# Patient Record
Sex: Female | Born: 1960 | Hispanic: No | Marital: Married | Smoking: Never smoker
Health system: Southern US, Community
[De-identification: ages and names within clinical notes are randomized; demographics above are authoritative.]

## PROBLEM LIST (undated history)

## (undated) DIAGNOSIS — I10 Essential (primary) hypertension: Secondary | ICD-10-CM

---

## 2007-02-02 DIAGNOSIS — R87610 Atypical squamous cells of undetermined significance on cytologic smear of cervix (ASC-US): Secondary | ICD-10-CM | POA: Insufficient documentation

## 2007-02-10 ENCOUNTER — Encounter (INDEPENDENT_AMBULATORY_CARE_PROVIDER_SITE_OTHER): Payer: Self-pay | Admitting: Nurse Practitioner

## 2007-02-18 ENCOUNTER — Emergency Department (HOSPITAL_COMMUNITY): Admission: EM | Admit: 2007-02-18 | Discharge: 2007-02-18 | Payer: Self-pay | Admitting: Emergency Medicine

## 2007-05-26 ENCOUNTER — Encounter (INDEPENDENT_AMBULATORY_CARE_PROVIDER_SITE_OTHER): Payer: Self-pay | Admitting: Internal Medicine

## 2007-05-26 ENCOUNTER — Ambulatory Visit: Payer: Self-pay | Admitting: Nurse Practitioner

## 2007-05-26 DIAGNOSIS — IMO0002 Reserved for concepts with insufficient information to code with codable children: Secondary | ICD-10-CM

## 2007-05-26 DIAGNOSIS — I10 Essential (primary) hypertension: Secondary | ICD-10-CM | POA: Insufficient documentation

## 2007-05-26 DIAGNOSIS — M1710 Unilateral primary osteoarthritis, unspecified knee: Secondary | ICD-10-CM | POA: Insufficient documentation

## 2007-06-01 ENCOUNTER — Encounter (INDEPENDENT_AMBULATORY_CARE_PROVIDER_SITE_OTHER): Payer: Self-pay | Admitting: Nurse Practitioner

## 2007-06-22 ENCOUNTER — Ambulatory Visit: Payer: Self-pay | Admitting: Nurse Practitioner

## 2007-06-22 DIAGNOSIS — J00 Acute nasopharyngitis [common cold]: Secondary | ICD-10-CM | POA: Insufficient documentation

## 2007-06-27 ENCOUNTER — Other Ambulatory Visit: Admission: RE | Admit: 2007-06-27 | Discharge: 2007-06-27 | Payer: Self-pay | Admitting: Family Medicine

## 2007-06-27 ENCOUNTER — Ambulatory Visit: Payer: Self-pay | Admitting: Nurse Practitioner

## 2007-06-27 LAB — CONVERTED CEMR LAB
Bilirubin Urine: NEGATIVE
Chlamydia, DNA Probe: NEGATIVE
GC Probe Amp, Genital: NEGATIVE
Nitrite: NEGATIVE
Pap Smear: NEGATIVE
Protein, U semiquant: NEGATIVE
Urobilinogen, UA: 0.2
WBC Urine, dipstick: NEGATIVE

## 2007-07-13 ENCOUNTER — Ambulatory Visit: Payer: Self-pay | Admitting: Nurse Practitioner

## 2007-12-06 ENCOUNTER — Ambulatory Visit: Payer: Self-pay | Admitting: Nurse Practitioner

## 2007-12-06 DIAGNOSIS — R3129 Other microscopic hematuria: Secondary | ICD-10-CM

## 2007-12-06 DIAGNOSIS — M545 Low back pain: Secondary | ICD-10-CM

## 2007-12-06 LAB — CONVERTED CEMR LAB
Glucose, Urine, Semiquant: NEGATIVE
Ketones, urine, test strip: NEGATIVE
Nitrite: NEGATIVE
Specific Gravity, Urine: <1.005

## 2007-12-07 ENCOUNTER — Encounter (INDEPENDENT_AMBULATORY_CARE_PROVIDER_SITE_OTHER): Payer: Self-pay | Admitting: Nurse Practitioner

## 2007-12-07 ENCOUNTER — Emergency Department (HOSPITAL_COMMUNITY): Admission: EM | Admit: 2007-12-07 | Discharge: 2007-12-07 | Payer: Self-pay | Admitting: Emergency Medicine

## 2008-02-13 ENCOUNTER — Ambulatory Visit: Payer: Self-pay | Admitting: Nurse Practitioner

## 2008-02-13 DIAGNOSIS — B354 Tinea corporis: Secondary | ICD-10-CM | POA: Insufficient documentation

## 2008-06-13 ENCOUNTER — Ambulatory Visit: Payer: Self-pay | Admitting: Nurse Practitioner

## 2008-06-13 DIAGNOSIS — L439 Lichen planus, unspecified: Secondary | ICD-10-CM | POA: Insufficient documentation

## 2008-06-14 LAB — CONVERTED CEMR LAB

## 2008-06-21 ENCOUNTER — Encounter: Admission: RE | Admit: 2008-06-21 | Discharge: 2008-06-21 | Payer: Self-pay | Admitting: Pulmonary Disease

## 2008-06-22 ENCOUNTER — Ambulatory Visit: Payer: Self-pay | Admitting: Nurse Practitioner

## 2008-06-22 DIAGNOSIS — B009 Herpesviral infection, unspecified: Secondary | ICD-10-CM | POA: Insufficient documentation

## 2008-06-30 ENCOUNTER — Emergency Department (HOSPITAL_COMMUNITY): Admission: EM | Admit: 2008-06-30 | Discharge: 2008-06-30 | Payer: Self-pay | Admitting: Family Medicine

## 2008-07-18 ENCOUNTER — Emergency Department (HOSPITAL_COMMUNITY): Admission: EM | Admit: 2008-07-18 | Discharge: 2008-07-18 | Payer: Self-pay | Admitting: Emergency Medicine

## 2008-08-02 ENCOUNTER — Telehealth (INDEPENDENT_AMBULATORY_CARE_PROVIDER_SITE_OTHER): Payer: Self-pay | Admitting: Nurse Practitioner

## 2008-08-15 ENCOUNTER — Ambulatory Visit: Payer: Self-pay | Admitting: Nurse Practitioner

## 2008-09-17 ENCOUNTER — Encounter (INDEPENDENT_AMBULATORY_CARE_PROVIDER_SITE_OTHER): Payer: Self-pay | Admitting: Nurse Practitioner

## 2009-11-02 IMAGING — CR DG CHEST 2V
3 series · 3 of 3 positions shown · non-contrast
Comparison: None

CLINICAL DATA: Positive PPD.

CHEST - 2 VIEW

[view not recorded (1 of 3)]
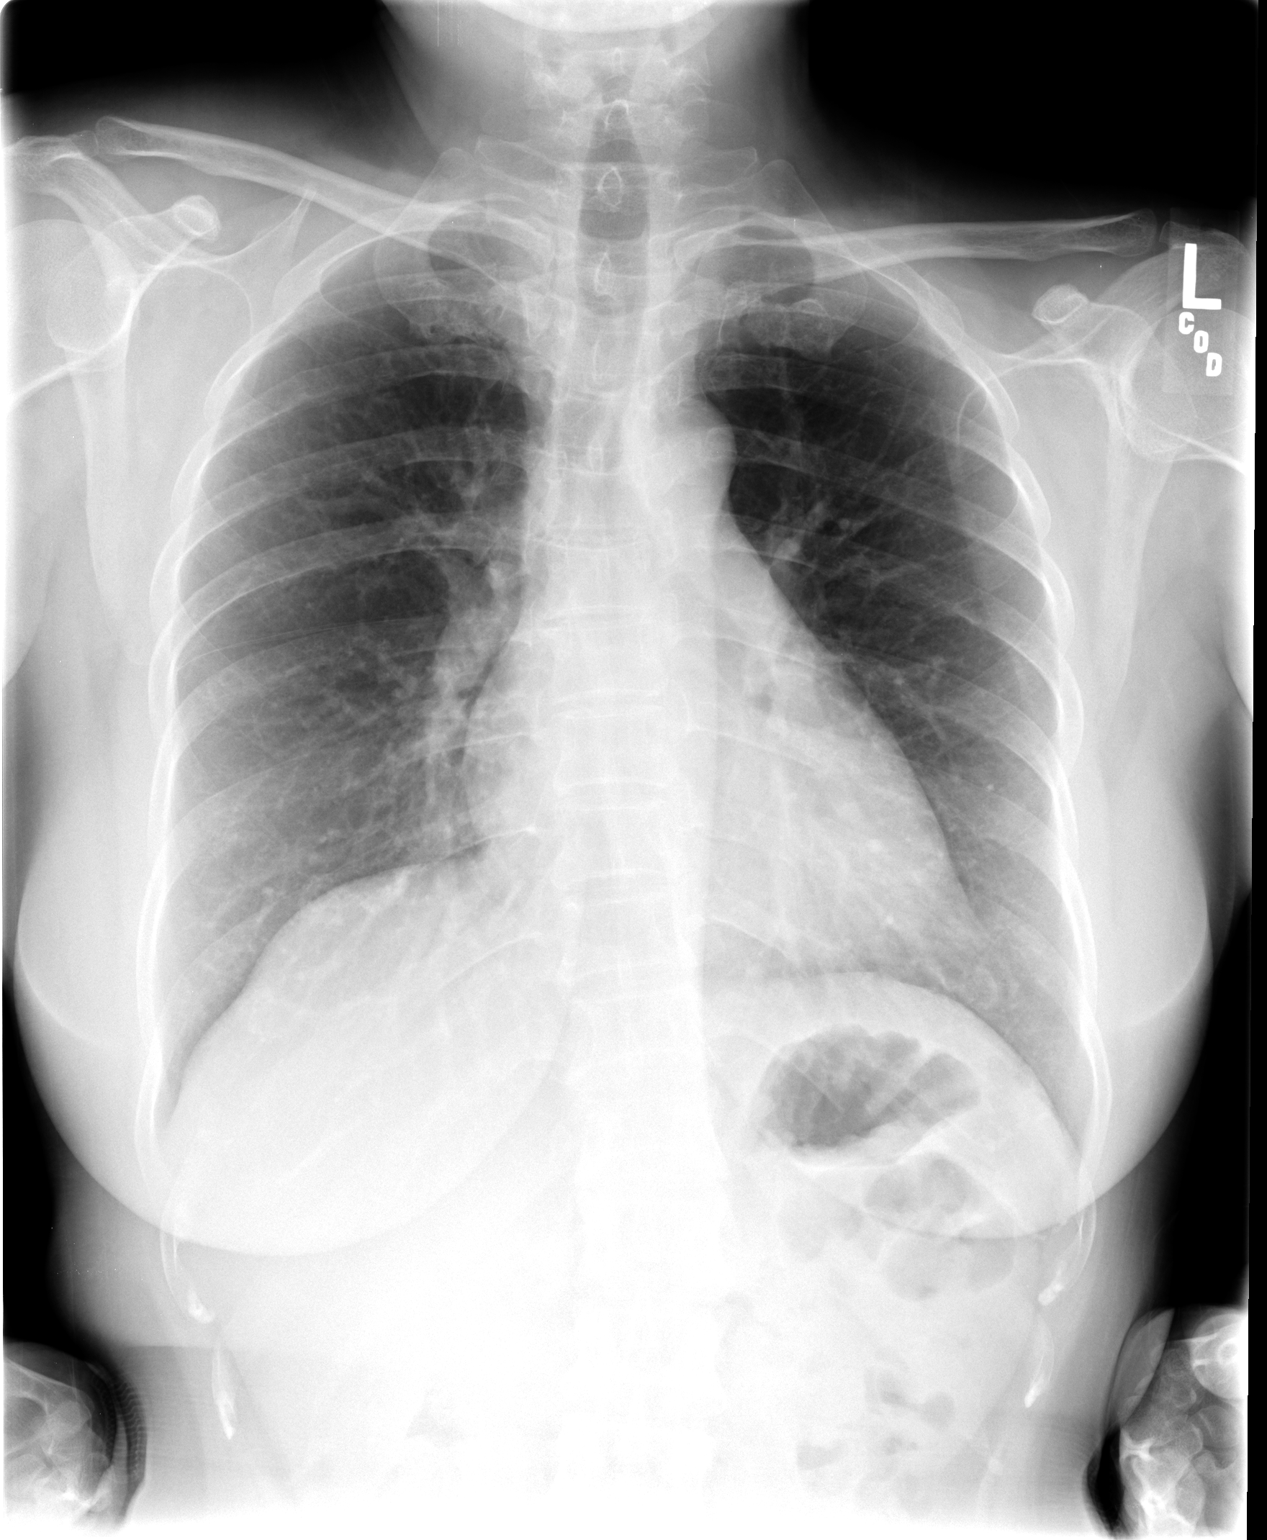

[view not recorded (2 of 3)]
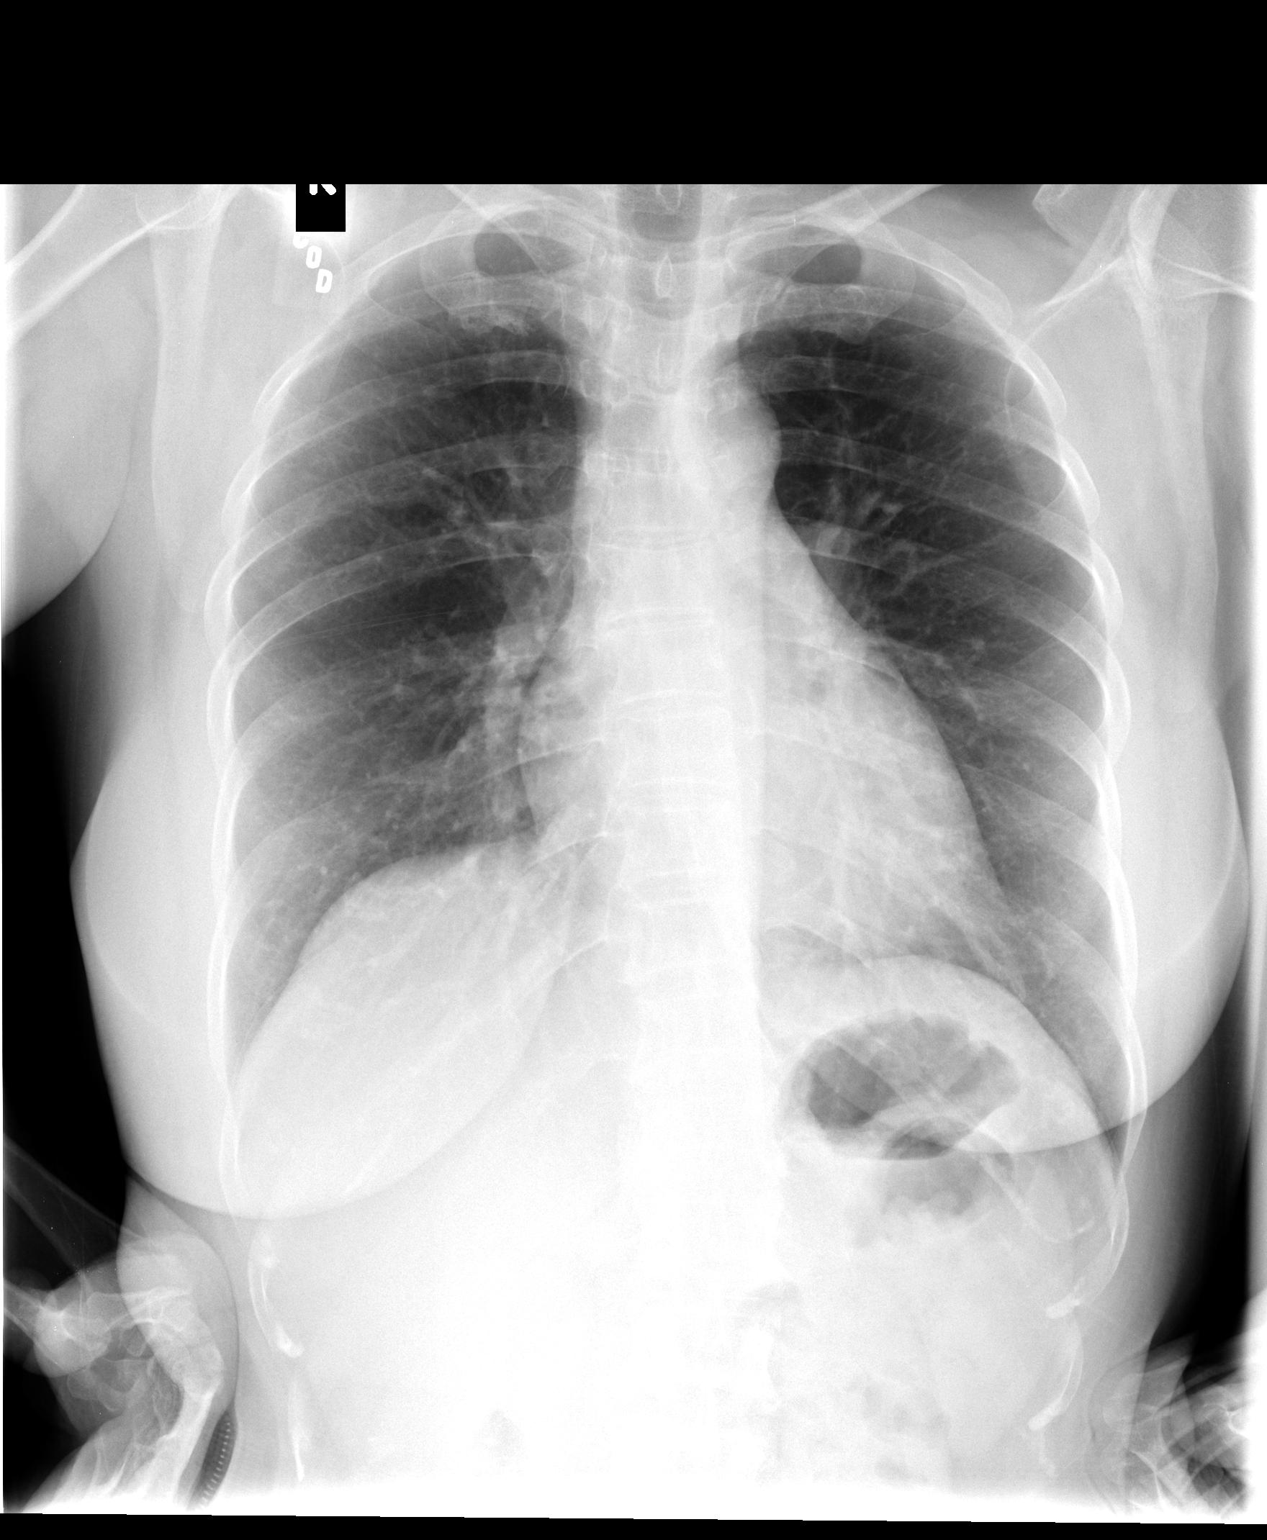

[view not recorded (3 of 3)]
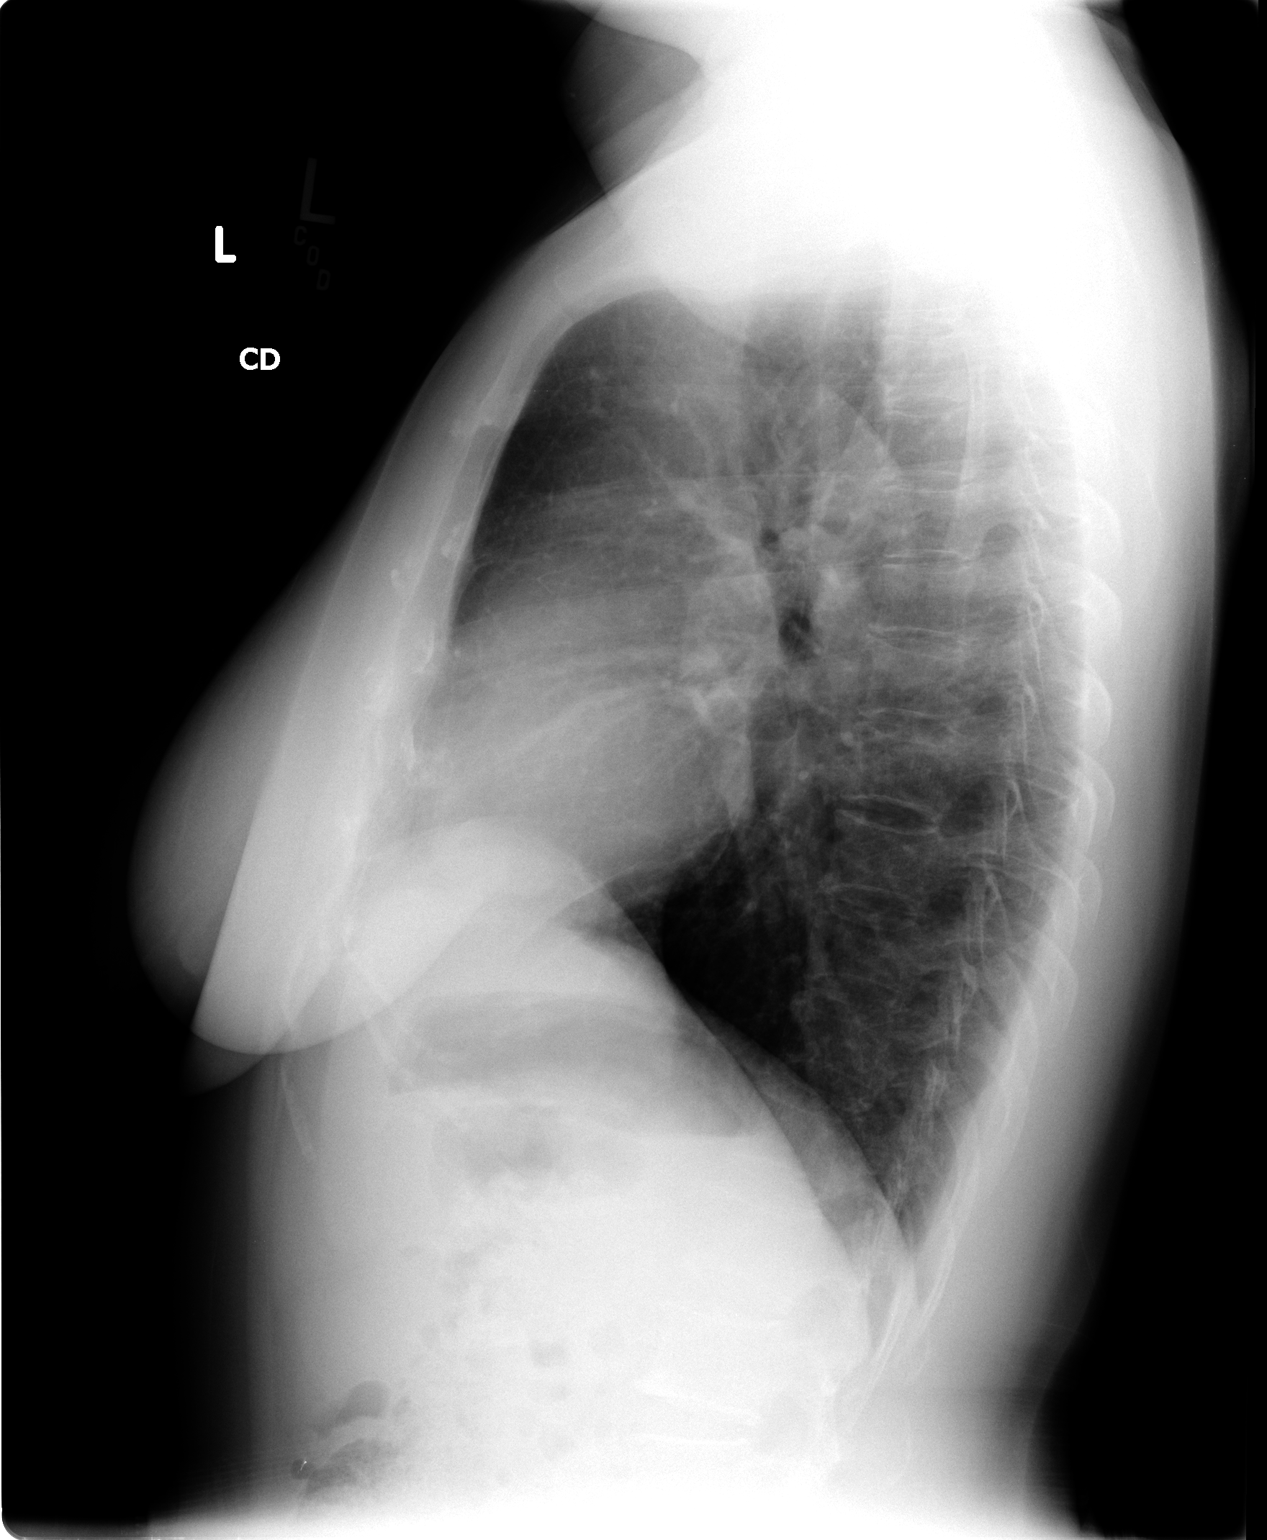

[3 of 3 positions shown; findings below may reference images not displayed]

FINDINGS: Lungs are clear.  Heart size and configuration appear
normal.  Mediastinum, hila, pleura appear normal.  Slight scoliosis
noted with no other significant osseous lesion.
IMPRESSION: 1.  Slight scoliosis.
2.  No active cardiopulmonary disease.

## 2010-08-14 LAB — CBC
HCT: 39.7 % (ref 36.0–46.0)
Hemoglobin: 13.2 g/dL (ref 12.0–15.0)
MCHC: 33.2 g/dL (ref 30.0–36.0)
MCV: 80.9 fL (ref 78.0–100.0)
RBC: 4.91 MIL/uL (ref 3.87–5.11)

## 2010-08-14 LAB — DIFFERENTIAL
Basophils Relative: 0 % (ref 0–1)
Eosinophils Absolute: 0.1 10*3/uL (ref 0.0–0.7)
Eosinophils Relative: 1 % (ref 0–5)
Monocytes Absolute: 0.3 10*3/uL (ref 0.1–1.0)
Monocytes Relative: 6 % (ref 3–12)

## 2010-08-14 LAB — URINE MICROSCOPIC-ADD ON

## 2010-08-14 LAB — BASIC METABOLIC PANEL
CO2: 27 mEq/L (ref 19–32)
Chloride: 101 mEq/L (ref 96–112)
GFR calc Af Amer: >60 mL/min (ref >60)
Glucose, Bld: 116 mg/dL — ABNORMAL HIGH (ref 70–99)
Sodium: 138 mEq/L (ref 135–145)

## 2010-08-14 LAB — URINALYSIS, ROUTINE W REFLEX MICROSCOPIC
Bilirubin Urine: NEGATIVE
Ketones, ur: NEGATIVE mg/dL
Nitrite: NEGATIVE
Specific Gravity, Urine: 1.022 (ref 1.005–1.030)
Urobilinogen, UA: 0.2 mg/dL (ref 0.0–1.0)

## 2011-01-30 LAB — POCT URINALYSIS DIP (DEVICE)
Glucose, UA: NEGATIVE
Protein, ur: NEGATIVE
Specific Gravity, Urine: 1.01
Urobilinogen, UA: 0.2
pH: 6

## 2011-01-30 LAB — POCT PREGNANCY, URINE: Preg Test, Ur: NEGATIVE

## 2013-07-02 ENCOUNTER — Emergency Department (HOSPITAL_COMMUNITY)
Admission: EM | Admit: 2013-07-02 | Discharge: 2013-07-02 | Disposition: A | Discharge status: Home / Self Care | Payer: Self-pay | Attending: Emergency Medicine | Admitting: Emergency Medicine

## 2013-07-02 ENCOUNTER — Emergency Department (HOSPITAL_COMMUNITY): Payer: Self-pay

## 2013-07-02 ENCOUNTER — Encounter (HOSPITAL_COMMUNITY): Payer: Self-pay | Admitting: Emergency Medicine

## 2013-07-02 DIAGNOSIS — I10 Essential (primary) hypertension: Secondary | ICD-10-CM | POA: Insufficient documentation

## 2013-07-02 DIAGNOSIS — R05 Cough: Secondary | ICD-10-CM | POA: Insufficient documentation

## 2013-07-02 DIAGNOSIS — R21 Rash and other nonspecific skin eruption: Secondary | ICD-10-CM | POA: Insufficient documentation

## 2013-07-02 DIAGNOSIS — N12 Tubulo-interstitial nephritis, not specified as acute or chronic: Secondary | ICD-10-CM | POA: Insufficient documentation

## 2013-07-02 DIAGNOSIS — R059 Cough, unspecified: Secondary | ICD-10-CM | POA: Insufficient documentation

## 2013-07-02 HISTORY — DX: Essential (primary) hypertension: I10

## 2013-07-02 LAB — CBC
HEMATOCRIT: 34.3 % — AB (ref 36.0–46.0)
HEMOGLOBIN: 11.6 g/dL — AB (ref 12.0–15.0)
MCH: 26.9 pg (ref 26.0–34.0)
MCHC: 33.8 g/dL (ref 30.0–36.0)
MCV: 79.4 fL (ref 78.0–100.0)
Platelets: 315 10*3/uL (ref 150–400)
RBC: 4.32 MIL/uL (ref 3.87–5.11)
RDW: 13.5 % (ref 11.5–15.5)
WBC: 9.6 10*3/uL (ref 4.0–10.5)

## 2013-07-02 LAB — BASIC METABOLIC PANEL
BUN: 8 mg/dL (ref 6–23)
CHLORIDE: 95 meq/L — AB (ref 96–112)
CO2: 24 mEq/L (ref 19–32)
Calcium: 9 mg/dL (ref 8.4–10.5)
Creatinine, Ser: 0.7 mg/dL (ref 0.50–1.10)
GLUCOSE: 142 mg/dL — AB (ref 70–99)
POTASSIUM: 4 meq/L (ref 3.7–5.3)
SODIUM: 133 meq/L — AB (ref 137–147)

## 2013-07-02 LAB — URINE MICROSCOPIC-ADD ON

## 2013-07-02 LAB — URINALYSIS, ROUTINE W REFLEX MICROSCOPIC
BILIRUBIN URINE: NEGATIVE
Glucose, UA: NEGATIVE mg/dL
Ketones, ur: NEGATIVE mg/dL
NITRITE: POSITIVE — AB
PH: 6 (ref 5.0–8.0)
Protein, ur: NEGATIVE mg/dL
SPECIFIC GRAVITY, URINE: 1.012 (ref 1.005–1.030)
UROBILINOGEN UA: 0.2 mg/dL (ref 0.0–1.0)

## 2013-07-02 MED ORDER — LEVOFLOXACIN 750 MG PO TABS
750.0000 mg | ORAL_TABLET | Freq: Once | ORAL | Status: AC
  Administered 2013-07-02: 750 mg via ORAL
  Filled 2013-07-02: qty 1

## 2013-07-02 MED ORDER — ACETAMINOPHEN 325 MG PO TABS
650.0000 mg | ORAL_TABLET | Freq: Four times a day (QID) | ORAL | Status: DC | PRN
  Administered 2013-07-02: 650 mg via ORAL
  Filled 2013-07-02 (×2): qty 2

## 2013-07-02 MED ORDER — ONDANSETRON HCL 4 MG PO TABS: 4.0000 mg | ORAL_TABLET | Freq: Four times a day (QID) | ORAL | Status: DC

## 2013-07-02 MED ORDER — LEVOFLOXACIN 750 MG PO TABS: 750.0000 mg | ORAL_TABLET | Freq: Every day | ORAL | Status: AC

## 2013-07-02 NOTE — ED Notes (Addendum)
Pt c/o intermittent dry cough and fevers x 2 weeks. She took some mucinex with no relief. She c/o R sided rib pain when coughing. shes also noticed her urine has been dark. She speaks karen.

## 2013-07-02 NOTE — ED Provider Notes (Signed)
CSN: [id number]     Arrival date & time 07/02/13  1524 History   First MD Initiated Contact with Patient 07/02/13 1651     Chief Complaint  Patient presents with  . Fever   HPI Comments: 53 yo F no significant PMHx presents with CC of fever.  Pt states this started 2 weeks ago.  Fever high of 102.8 in ED.  She has had associated nonproductive cough, right flank pain, dark urine, dysuria, and nonpruritic, nonpainful rash of her neck.  She denies CP, SOB, abdominal pain, vaginal symptoms, myalgias, or any other symptoms.  Pt has been taking OTC cold medicine without relief.  Denies recent travel in last 3 months.  She denies prior occurrence.  No other complaints.  Pt speaks [place], and son served as Engineer, technical sales.    The history is provided by the patient and a relative. The history is limited by a language barrier. A language interpreter was used.    Past Medical History  Diagnosis Date  . Hypertension    History reviewed. No pertinent past surgical history. History reviewed. No pertinent family history. History  Substance Use Topics  . Smoking status: Never Smoker   . Smokeless tobacco: Not on file  . Alcohol Use: No   OB History   Grav Para Term Preterm Abortions TAB SAB Ect Mult Living                 Review of Systems  Constitutional: Positive for fever and chills.  Respiratory: Positive for cough. Negative for shortness of breath, wheezing and stridor.   Cardiovascular: Negative for chest pain, palpitations and leg swelling.  Gastrointestinal: Negative for nausea, vomiting, abdominal pain, diarrhea and constipation.  Genitourinary: Positive for dysuria and flank pain. Negative for hematuria, vaginal bleeding and vaginal discharge.  Musculoskeletal: Negative for myalgias.  Skin: Positive for rash.  Neurological: Negative for dizziness, weakness, light-headedness, numbness and headaches.  Hematological: Negative for adenopathy. Does not bruise/bleed easily.  All other systems  reviewed and are negative.      Allergies  Review of patient's allergies indicates no known allergies.  Home Medications  No current outpatient prescriptions on file. BP 102/70  Pulse 89  Temp(Src) 102.8 F (39.3 C) (Oral)  Resp 17  Wt 150 lb 4.8 oz (68.176 kg)  SpO2 99% Physical Exam  Nursing note and vitals reviewed. Constitutional: She is oriented to person, place, and time. She appears well-developed and well-nourished.  HENT:  Head: Normocephalic and atraumatic.  Right Ear: External ear normal.  Left Ear: External ear normal.  Nose: Nose normal.  Mouth/Throat: Oropharynx is clear and moist.  Eyes: Conjunctivae and EOM are normal. Pupils are equal, round, and reactive to light.  Neck: Normal range of motion. Neck supple.  Cardiovascular: Normal rate, regular rhythm, normal heart sounds and intact distal pulses.   Pulmonary/Chest: Effort normal and breath sounds normal. No respiratory distress. She has no wheezes. She has no rales. She exhibits no tenderness.  Abdominal: Soft. Bowel sounds are normal. She exhibits no distension and no mass. There is no tenderness. There is no rebound and no guarding.  Musculoskeletal: Normal range of motion.  Slight R CVA TTP.  Neurological: She is alert and oriented to person, place, and time.  Skin: Skin is warm and dry.  Two small erythematous patches, with associated pustules on neck.  No excoriation.  No cellulitis.  No TTP.      ED Course  Procedures (including critical care time) Labs Review Labs Reviewed  CBC - Abnormal; Notable for the following:    Hemoglobin 11.6 (*)    HCT 34.3 (*)    All other components within normal limits  BASIC METABOLIC PANEL - Abnormal; Notable for the following:    Sodium 133 (*)    Chloride 95 (*)    Glucose, Bld 142 (*)    All other components within normal limits  URINALYSIS, ROUTINE W REFLEX MICROSCOPIC - Abnormal; Notable for the following:    APPearance CLOUDY (*)    Hgb urine  dipstick MODERATE (*)    Nitrite POSITIVE (*)    Leukocytes, UA LARGE (*)    All other components within normal limits  URINE MICROSCOPIC-ADD ON - Abnormal; Notable for the following:    Bacteria, UA MANY (*)    All other components within normal limits   Imaging Review Dg Chest 2 View  07/02/2013   CLINICAL DATA:  Fever, cough, hypertension  EXAM: CHEST  2 VIEW  COMPARISON:  07/18/2008  FINDINGS: Cardiomediastinal silhouette is stable. Mild degenerative changes thoracic spine. No acute infiltrate or pleural effusion. No pulmonary edema. Central mild bronchitic changes.  IMPRESSION: No acute infiltrate or pulmonary edema. Central mild bronchitic changes.   Electronically Signed   By: Natasha MeadLiviu  Pop M.D.   On: 07/02/2013 17:37     EKG Interpretation None      MDM   Final diagnoses:  None   53 yo F no significant PMHx presents with CC of fever.  Filed Vitals:   07/02/13 1830  BP: 99/65  Pulse: 71  Temp:   Resp:    Physical exam as above.  Pt with fever high of 102.8 here.  Other VS WNL.  Pt does not appear in any distress.  Pt with mild R CVA TTP, no abdominal TTP.  Lungs CTAB.  CXR negative for infiltrate, but does show some mild bronchitic changes.  CBC shows normal WBC count, with mild microcytic anemia Hgb 11.6.  BMP mild hyponatremia 133, and hypochloremia 95.  BUN/Cr WNL @ 8/0.70.  UA positive nitrites, positive leukocytes, many bacteria.    Bilateral kidney US performed as below, and demonstrates evidence of mild hydronephrosis on right.  EMERGENCY DEPARTMENT US KIDNEYS Study: Limited Ultrasound of the Kidneys  INDICATIONS: Fever, dysuria, right flank pain Indication: Multiple views of bilateral kidney's transverse and sagittal planes with a multi- Frequency probe.  PERFORMED BY: Myself  IMAGES ARCHIVED?: Yes  FINDINGS: Mild right sided hydronephrosis  LIMITATIONS:  Body habitus, rib shadow  INTERPRETATION:  Mild right sided hydronephrosis  COMMENT:   N/A  Diagnosis of pyelonephritis.  Pt to be d/c home in good condition.  Pt given 750 mg Levaquin in ED, and Rx for Levaquin 750 mg PO daily X 4 days.  Encouraged to continue supportive care.  F/u with PCP in 1 week.  Return precautions given.  Pt understands and agrees with plan.  Pt's care plan discussed with Dr. Jodi MourningZavitz.   Jon GillsWebb, Miasia Crabtree, MD     Jon GillsZach Areil Ottey, MD 07/03/13 [phone]

## 2013-07-02 NOTE — ED Notes (Signed)
Patient transported to X-ray 

## 2013-07-02 NOTE — ED Notes (Addendum)
Pt presents with a intermittent fever x2 weeks, a dry cough and Right side rib pain due to cough x2 weeks. Pt also reports dark urine and burning with urination. Pt denies chills, body aches, SOB, or chest pain.  Pt denies taking any OTC antipyretic

## 2013-07-02 NOTE — Discharge Instructions (Signed)
Pyelonephritis, Adult °Pyelonephritis is a kidney infection. A kidney infection can happen quickly, or it can last for a long time. °HOME CARE  °· Take your medicine (antibiotics) as told. Finish it even if you start to feel better. °· Keep all doctor visits as told. °· Drink enough fluids to keep your pee (urine) clear or pale yellow. °· Only take medicine as told by your doctor. °GET HELP RIGHT AWAY IF:  °· You have a fever or lasting symptoms for more than 2-3 days. °· You have a fever and your symptoms suddenly get worse. °· You cannot take your medicine or drink fluids as told. °· You have chills and shaking. °· You feel very weak or pass out (faint). °· You do not feel better after 2 days. °MAKE SURE YOU: °· Understand these instructions. °· Will watch your condition. °· Will get help right away if you are not doing well or get worse. °Document Released: 05/28/2004 Document Revised: 10/20/2011 Document Reviewed: 10/08/2010 °ExitCare® Patient Information ©2014 [employer]. ° °

## 2013-07-02 NOTE — ED Notes (Signed)
UA collected upon pt's arrival to room, Dr. Hyman HopesWebb resident asked about results, urine found on bedside table and sent to lab by this RN

## 2013-07-02 NOTE — ED Notes (Signed)
Pt given water and tolerated with no difficulty. More water given per pt request

## 2013-07-03 NOTE — ED Provider Notes (Addendum)
Medical screening examination/treatment/procedure(s) were conducted as a shared visit with non-physician practitioner(s) or resident and myself. I personally evaluated the patient during the encounter and agree with the findings and plan unless otherwise indicated.  I have personally reviewed any xrays and/ or EKG's with the provider and I agree with interpretation.  Intermittent right flank pain and now fevers the past two weeks. No current abx or recent travel. No kidney stone hx. Translated by son who speaks [place] well. No abdo pain. Exam abdo soft/ NT/ no guarding, mild dry mm, right flank pain mild, well appearing, RRR. Concern for pyelo vs less likely kidney stone or pneumonia. Antipyretics given.  UA infected.  PO abx in ed. I was present for bedside [place] done by the resident and there was no significant hydronephrosis seen. Right flank pain, Pyelonephritis   Enid SkeensJoshua M Hayden Kihara, MD 07/03/13 [id number]  Enid SkeensJoshua M Rahil Passey, MD 07/13/13 [phone]

## 2013-08-14 ENCOUNTER — Emergency Department (INDEPENDENT_AMBULATORY_CARE_PROVIDER_SITE_OTHER)
Admission: EM | Admit: 2013-08-14 | Discharge: 2013-08-14 | Disposition: A | Discharge status: Home / Self Care | Payer: Self-pay | Source: Home / Self Care | Attending: Family Medicine | Admitting: Family Medicine

## 2013-08-14 ENCOUNTER — Encounter (HOSPITAL_COMMUNITY): Payer: Self-pay | Admitting: Emergency Medicine

## 2013-08-14 DIAGNOSIS — M549 Dorsalgia, unspecified: Secondary | ICD-10-CM

## 2013-08-14 DIAGNOSIS — R109 Unspecified abdominal pain: Secondary | ICD-10-CM

## 2013-08-14 DIAGNOSIS — R079 Chest pain, unspecified: Secondary | ICD-10-CM

## 2013-08-14 MED ORDER — TRAMADOL HCL 50 MG PO TABS: 50.0000 mg | ORAL_TABLET | Freq: Four times a day (QID) | ORAL | Status: DC | PRN
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
## 2013-08-15 NOTE — ED Provider Notes (Signed)
Medical screening examination/treatment/procedure(s) were performed by resident physician or non-physician practitioner and as supervising physician I was immediately available for consultation/collaboration.   Caige Almeda DOUGLAS MD.   Cherrie Franca D Ryleigh Buenger, MD 08/15/13 0919 

## 2013-09-07 ENCOUNTER — Emergency Department (INDEPENDENT_AMBULATORY_CARE_PROVIDER_SITE_OTHER)
Admission: EM | Admit: 2013-09-07 | Discharge: 2013-09-07 | Disposition: A | Discharge status: Home / Self Care | Payer: Self-pay | Source: Home / Self Care

## 2013-09-07 ENCOUNTER — Emergency Department (INDEPENDENT_AMBULATORY_CARE_PROVIDER_SITE_OTHER): Payer: Self-pay

## 2013-09-07 ENCOUNTER — Encounter (HOSPITAL_COMMUNITY): Payer: Self-pay | Admitting: Family Medicine

## 2013-09-07 DIAGNOSIS — M940 Chondrocostal junction syndrome [Tietze]: Secondary | ICD-10-CM

## 2013-09-07 DIAGNOSIS — IMO0001 Reserved for inherently not codable concepts without codable children: Secondary | ICD-10-CM

## 2013-09-07 DIAGNOSIS — Y92410 Unspecified street and highway as the place of occurrence of the external cause: Secondary | ICD-10-CM

## 2013-09-07 DIAGNOSIS — M7918 Myalgia, other site: Secondary | ICD-10-CM

## 2013-09-07 MED ORDER — TRAMADOL HCL 50 MG PO TABS: 50.0000 mg | ORAL_TABLET | Freq: Four times a day (QID) | ORAL | Status: AC | PRN

## 2013-09-07 MED ORDER — CYCLOBENZAPRINE HCL 5 MG PO TABS: 5.0000 mg | ORAL_TABLET | Freq: Three times a day (TID) | ORAL | Status: AC | PRN

## 2013-09-07 MED ORDER — IBUPROFEN 600 MG PO TABS: 600.0000 mg | ORAL_TABLET | Freq: Four times a day (QID) | ORAL | Status: AC | PRN
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
## 2013-09-08 NOTE — ED Provider Notes (Signed)
Medical screening examination/treatment/procedure(s) were performed by a resident physician and as supervising physician I was immediately available for consultation/collaboration.  Leslee Homeavid Tiea Manninen, M.D.  Reuben Likesavid C Cambren Helm, MD 09/08/13 [phone]

## 2021-08-27 ENCOUNTER — Other Ambulatory Visit: Payer: Self-pay

## 2021-08-27 DIAGNOSIS — R030 Elevated blood-pressure reading, without diagnosis of hypertension: Secondary | ICD-10-CM

## 2021-08-27 LAB — GLUCOSE, POCT (MANUAL RESULT ENTRY): POC Glucose: 106 mg/dl — AB (ref 70–99)

## 2021-08-27 NOTE — Congregational Nurse Program (Signed)
?  Dept: [phone] ? ? ?Congregational Nurse Program Note ? ?Date of Encounter: 08/27/2021 ? ?Past Medical History: ?Past Medical History:  ?Diagnosis Date  ? Hypertension   ? ? ?Encounter Details: ? CNP Questionnaire - 08/27/21 1141   ? ?  ? Questionnaire  ? Do you give verbal consent to treat you today? Yes   ? Location Patient Served  [facility]   ? Visit Setting Church or Organization   ? Patient Status Refugee   ? Insurance Uninsured ([employer] Card/Care Connects/Self-Pay)   ? Insurance Referral [facility]   ? Medication Have Medication Insecurities   ? Medical Provider No   ? Screening Referrals N/A   ? Medical Referral [facility]   ? Medical Appointment Made [facility]   ? Food N/A   ? Transportation N/A   ? Housing/Utilities N/A   ? Interpersonal Safety N/A   ? Intervention Blood pressure;Blood glucose;Advocate;Navigate Healthcare System;Case Management;Counsel;Educate;Support   ? ED Visit Averted Yes   ? Life-Saving Intervention Made N/A   ? ?  ?  ? ?  ? ?Uninsured patient here today for orange card application. Noted multiple visit to ED. BP elevated on medication. She does not recall the name of medication. Requested to bring the pill bottle next week Tuesday. Educated on healthy diet low salt and exercise.Referral done to request PCP. ? ?Arman Bogus RN BSN PCCN  ?[facility] ?[phone]-cell ?[phone] ? ? ? ?

## 2021-09-04 ENCOUNTER — Encounter: Payer: Self-pay | Admitting: Nurse Practitioner

## 2021-09-04 ENCOUNTER — Ambulatory Visit (INDEPENDENT_AMBULATORY_CARE_PROVIDER_SITE_OTHER): Payer: Self-pay | Admitting: Nurse Practitioner

## 2021-09-04 VITALS — BP 157/92 | HR 66 | Temp 98.2°F | Ht <= 58 in | Wt 154.1 lb

## 2021-09-04 DIAGNOSIS — M25561 Pain in right knee: Secondary | ICD-10-CM

## 2021-09-04 DIAGNOSIS — M25551 Pain in right hip: Secondary | ICD-10-CM

## 2021-09-04 DIAGNOSIS — G8929 Other chronic pain: Secondary | ICD-10-CM

## 2021-09-04 DIAGNOSIS — I10 Essential (primary) hypertension: Secondary | ICD-10-CM

## 2021-09-04 DIAGNOSIS — Z0000 Encounter for general adult medical examination without abnormal findings: Secondary | ICD-10-CM

## 2021-09-04 LAB — POCT URINALYSIS DIP (CLINITEK)
Bilirubin, UA: NEGATIVE
Blood, UA: NEGATIVE
Glucose, UA: NEGATIVE mg/dL
Ketones, POC UA: NEGATIVE mg/dL
Leukocytes, UA: NEGATIVE
Nitrite, UA: NEGATIVE
POC PROTEIN,UA: NEGATIVE
Spec Grav, UA: 1.01 (ref 1.010–1.025)
Urobilinogen, UA: 0.2 E.U./dL
pH, UA: 6 (ref 5.0–8.0)

## 2021-09-04 LAB — POCT GLYCOSYLATED HEMOGLOBIN (HGB A1C)
HbA1c POC (<> result, manual entry): 5.7 % (ref 4.0–5.6)
HbA1c, POC (controlled diabetic range): 5.7 % (ref 0.0–7.0)
HbA1c, POC (prediabetic range): 5.7 % (ref 5.7–6.4)
Hemoglobin A1C: 5.7 % — AB (ref 4.0–5.6)

## 2021-09-04 MED ORDER — AMLODIPINE BESYLATE 10 MG PO TABS: 10.0000 mg | ORAL_TABLET | Freq: Every day | ORAL | 0 refills | Status: DC

## 2021-09-04 NOTE — Progress Notes (Signed)
@Patient  ID: Becky Spears, female    DOB: 1961/01/13, 61 y.o.   MRN: [id number] ? ?Chief Complaint  ?Patient presents with  ? Establish Care  ?  Pt stated she is here to establish care. Pt stated she is concern about her high BP  ? ? ?Referring provider: ?No ref. provider found ? ? ?HPI ? ?Patient presents today to establish care. Interpreter used for this visit. ? ?taking medications as instructed, no medication side effects noted, no TIA's, no chest pain on exertion, no dyspnea on exertion, and no swelling of ankles.  ?New concerns: chronic low back and knee pain on the right side.  Patient states that she has had x-rays completed.  Will refer to ortho. ? ?Hemoglobin A1c in office today was 5.7  ? ? ?Allergies  ?Allergen Reactions  ? Ibuprofen Other (See Comments)  ?  Pt stated when she takes ibuprofen her skin has a burning sensation  ? ? ?Immunization History  ?Administered Date(s) Administered  ? Influenza,inj,Quad PF,6+ Mos 02/18/2017, 02/17/2018  ? ? ?Past Medical History:  ?Diagnosis Date  ? Hypertension   ? ? ?Tobacco History: ?Social History  ? ?Tobacco Use  ?Smoking Status Never  ?Smokeless Tobacco Not on file  ? ?Counseling given: Not Answered ? ? ?Outpatient Encounter Medications as of 09/04/2021  ?Medication Sig  ? acetaminophen (TYLENOL) 500 MG tablet Take 500 mg by mouth continuous as needed.  ? amLODipine (NORVASC) 10 MG tablet Take 1 tablet (10 mg total) by mouth daily.  ? [DISCONTINUED] amLODipine (NORVASC) 5 MG tablet Take 5 mg by mouth 1 day or 1 dose.  ? cyclobenzaprine (FLEXERIL) 5 MG tablet Take 1 tablet (5 mg total) by mouth 3 (three) times daily as needed for muscle spasms. (Patient not taking: Reported on 09/04/2021)  ? ibuprofen (ADVIL,MOTRIN) 600 MG tablet Take 1 tablet (600 mg total) by mouth every 6 (six) hours as needed. (Patient not taking: Reported on 09/04/2021)  ? levofloxacin (LEVAQUIN) 750 MG tablet Take 1 tablet (750 mg total) by mouth daily. (Patient not taking: Reported on 09/04/2021)  ?  ondansetron (ZOFRAN) 4 MG tablet Take 1 tablet (4 mg total) by mouth every 6 (six) hours. (Patient not taking: Reported on 09/04/2021)  ? traMADol (ULTRAM) 50 MG tablet Take 1 tablet (50 mg total) by mouth every 6 (six) hours as needed. (Patient not taking: Reported on 09/04/2021)  ? traMADol (ULTRAM) 50 MG tablet Take 1 tablet (50 mg total) by mouth every 6 (six) hours as needed. (Patient not taking: Reported on 09/04/2021)  ? [DISCONTINUED] amLODipine (NORVASC) 5 MG tablet Take 5 mg by mouth daily.  ? ?No facility-administered encounter medications on file as of 09/04/2021.  ? ? ? ?Review of Systems ? ?Review of Systems  ?Constitutional: Negative.   ?HENT: Negative.    ?Cardiovascular: Negative.   ?Gastrointestinal: Negative.   ?Musculoskeletal:  Positive for back pain.  ?Allergic/Immunologic: Negative.   ?Neurological: Negative.   ?Psychiatric/Behavioral: Negative.     ? ? ? ?Physical Exam ? ?BP (!) 157/92 (BP Location: Right Arm, Patient Position: Sitting, Cuff Size: Normal)   Pulse 66   Temp 98.2 ?F (36.8 ?C)   Ht 4\' 10"  (1.473 m)   Wt 154 lb 2 oz (69.9 kg)   SpO2 98%   BMI 32.21 kg/m?  ? ?Wt Readings from Last 5 Encounters:  ?09/04/21 154 lb 2 oz (69.9 kg)  ?07/02/13 150 lb 4.8 oz (68.2 kg)  ? ? ? ?Physical Exam ?Vitals and nursing note reviewed.  ?  Constitutional:   ?   General: She is not in acute distress. ?   Appearance: She is well-developed.  ?Cardiovascular:  ?   Rate and Rhythm: Normal rate and regular rhythm.  ?Pulmonary:  ?   Effort: Pulmonary effort is normal.  ?   Breath sounds: Normal breath sounds.  ?Neurological:  ?   Mental Status: She is alert and oriented to person, place, and time.  ? ? ? ?Lab Results: ? ?CBC ?   ?Component Value Date/Time  ? WBC 4.2 09/04/2021 1501  ? WBC 9.6 07/02/2013 1544  ? RBC 4.78 09/04/2021 1501  ? RBC 4.32 07/02/2013 1544  ? HGB 12.8 09/04/2021 1501  ? HCT 38.8 09/04/2021 1501  ? PLT 214 09/04/2021 1501  ? MCV 81 09/04/2021 1501  ? MCH 26.8 09/04/2021 1501  ? MCH 26.9  07/02/2013 1544  ? MCHC 33.0 09/04/2021 1501  ? MCHC 33.8 07/02/2013 1544  ? RDW 14.6 09/04/2021 1501  ? LYMPHSABS 1.5 07/18/2008 0300  ? MONOABS 0.3 07/18/2008 0300  ? EOSABS 0.1 07/18/2008 0300  ? BASOSABS 0.0 07/18/2008 0300  ? ? ?BMET ?   ?Component Value Date/Time  ? NA 141 09/04/2021 1501  ? K 4.5 09/04/2021 1501  ? CL 103 09/04/2021 1501  ? CO2 26 09/04/2021 1501  ? GLUCOSE 98 09/04/2021 1501  ? GLUCOSE 142 (H) 07/02/2013 1544  ? BUN 11 09/04/2021 1501  ? CREATININE 0.67 09/04/2021 1501  ? CALCIUM 9.3 09/04/2021 1501  ? GFRNONAA >90 07/02/2013 1544  ? GFRAA >90 07/02/2013 1544  ? ? ?BNP ?No results found for: BNP ? ?ProBNP ?No results found for: PROBNP ? ?Imaging: ?No results found. ? ? ?Assessment & Plan:  ? ?Health care maintenance ?- POCT glycosylated hemoglobin (Hb A1C) ?- POCT URINALYSIS DIP (CLINITEK) ? ?2. Essential hypertension, benign ? ?- CBC ?- Comprehensive metabolic panel ? ?Follow up: ? ?Follow up in 4 weeks for physical ? ? ? ? ?Fenton Foy, NP ?09/05/2021 ? ?

## 2021-09-04 NOTE — Patient Instructions (Addendum)
1. Health care maintenance ? ?- POCT glycosylated hemoglobin (Hb A1C) ?- POCT URINALYSIS DIP (CLINITEK) ? ?2. Essential hypertension, benign ? ?- CBC ?- Comprehensive metabolic panel ? ?Follow up: ? ?Follow up in 4 weeks for physical ? ? ? ?

## 2021-09-05 ENCOUNTER — Encounter: Payer: Self-pay | Admitting: Nurse Practitioner

## 2021-09-05 DIAGNOSIS — Z0000 Encounter for general adult medical examination without abnormal findings: Secondary | ICD-10-CM | POA: Insufficient documentation

## 2021-09-05 LAB — COMPREHENSIVE METABOLIC PANEL
ALT: 24 IU/L (ref 0–32)
AST: 21 IU/L (ref 0–40)
Albumin/Globulin Ratio: 1.5 (ref 1.2–2.2)
Albumin: 4.4 g/dL (ref 3.8–4.9)
Alkaline Phosphatase: 83 IU/L (ref 44–121)
BUN/Creatinine Ratio: 16 (ref 12–28)
BUN: 11 mg/dL (ref 8–27)
Bilirubin Total: 0.4 mg/dL (ref 0.0–1.2)
CO2: 26 mmol/L (ref 20–29)
Calcium: 9.3 mg/dL (ref 8.7–10.3)
Chloride: 103 mmol/L (ref 96–106)
Creatinine, Ser: 0.67 mg/dL (ref 0.57–1.00)
Globulin, Total: 3 g/dL (ref 1.5–4.5)
Glucose: 98 mg/dL (ref 70–99)
Potassium: 4.5 mmol/L (ref 3.5–5.2)
Sodium: 141 mmol/L (ref 134–144)
Total Protein: 7.4 g/dL (ref 6.0–8.5)
eGFR: 100 mL/min/{1.73_m2} (ref >59)

## 2021-09-05 LAB — CBC
Hematocrit: 38.8 % (ref 34.0–46.6)
Hemoglobin: 12.8 g/dL (ref 11.1–15.9)
MCH: 26.8 pg (ref 26.6–33.0)
MCHC: 33 g/dL (ref 31.5–35.7)
MCV: 81 fL (ref 79–97)
Platelets: 214 10*3/uL (ref 150–450)
RBC: 4.78 x10E6/uL (ref 3.77–5.28)
RDW: 14.6 % (ref 11.7–15.4)
WBC: 4.2 10*3/uL (ref 3.4–10.8)

## 2021-09-05 NOTE — Assessment & Plan Note (Signed)
-   POCT glycosylated hemoglobin (Hb A1C) ?- POCT URINALYSIS DIP (CLINITEK) ? ?2. Essential hypertension, benign ? ?- CBC ?- Comprehensive metabolic panel ? ?Follow up: ? ?Follow up in 4 weeks for physical ?

## 2021-10-02 ENCOUNTER — Ambulatory Visit: Payer: Self-pay | Admitting: Nurse Practitioner

## 2021-12-01 ENCOUNTER — Telehealth: Payer: Self-pay

## 2021-12-01 MED ORDER — AMLODIPINE BESYLATE 10 MG PO TABS: 10.0000 mg | ORAL_TABLET | Freq: Every day | ORAL | 0 refills | Status: DC

## 2021-12-01 NOTE — Telephone Encounter (Signed)
No additional notes needed  

## 2022-02-18 ENCOUNTER — Ambulatory Visit: Payer: Self-pay

## 2022-02-18 DIAGNOSIS — Z23 Encounter for immunization: Secondary | ICD-10-CM

## 2022-02-25 NOTE — Congregational Nurse Program (Signed)
  Dept: [phone]   Congregational Nurse Program Note  Date of Encounter: 02/25/2022  Past Medical History: Past Medical History:  Diagnosis Date   Hypertension     Encounter Details:  CNP Questionnaire - 02/25/22 1119       Questionnaire   Ask client: Do you give verbal consent for me to treat you today? Yes    Student Assistance [employer] Nurse    Location Patient Served  [facility]    Visit Setting with Client [place]    Patient Status Refugee    Insurance Uninsured ([place] [employer] [employer]/Self-Pay/Medicaid Family Planning)    Insurance/Financial Assistance Referral N/A    Medication N/A    Medical Provider Yes    Screening Referrals Made N/A    Medical Referrals Made N/A    Medical Appointment Made N/A    Recently w/o PCP, now 1st time PCP visit completed due to CNs referral or appointment made N/A    Food N/A    Transportation N/A    Housing/Utilities N/A    Interpersonal Safety N/A    Interventions Advocate/Support;Navigate Healthcare System;Case Management;Counsel;Educate    Abnormal to Normal Screening Since Last CN Visit N/A    Screenings CN Performed Blood Pressure;Weight    Sent Client to Lab for: N/A    Did client attend any of the following based off CNs referral or appointments made? N/A    ED Visit Averted Yes    Life-Saving Intervention Made N/A            Patient came in for routine BP monitoring. Educated patient about normal BP range. Educated about diet (limit salt) and exercise.  Earlie Server Maliea Grandmaison RN BSN [place] Nurse [phone]

## 2022-04-23 ENCOUNTER — Encounter: Payer: Self-pay | Admitting: Nurse Practitioner

## 2022-04-23 ENCOUNTER — Other Ambulatory Visit: Payer: Self-pay

## 2022-04-23 ENCOUNTER — Ambulatory Visit (INDEPENDENT_AMBULATORY_CARE_PROVIDER_SITE_OTHER): Payer: Self-pay | Admitting: Nurse Practitioner

## 2022-04-23 VITALS — BP 156/87 | HR 68 | Temp 97.6°F | Ht <= 58 in | Wt 144.0 lb

## 2022-04-23 DIAGNOSIS — Z1231 Encounter for screening mammogram for malignant neoplasm of breast: Secondary | ICD-10-CM

## 2022-04-23 DIAGNOSIS — I10 Essential (primary) hypertension: Secondary | ICD-10-CM

## 2022-04-23 MED ORDER — AMLODIPINE BESYLATE 10 MG PO TABS
10.0000 mg | ORAL_TABLET | Freq: Every day | ORAL | 0 refills | Status: DC
  Filled 2022-04-23: qty 90, 90d supply, fill #0

## 2022-04-23 NOTE — Progress Notes (Signed)
@Patient  ID: Becky Spears, female    DOB: 03/02/1961, 61 y.o.   MRN: [id number]  Chief Complaint  Patient presents with   Hypertension     Follow-up stated--positive covid last week, but now retest is negative    Referring provider: [id number], NP   HPI  61 year old female with history of hypertension  Presents today for follow-up on hypertension.  Overall she has been doing well.  She did have COVID last week but did test negative this week.  She has been out of her blood pressure medicine.  Blood pressure was elevated in office today.  We will send in a refill.  Patient wants to hold on Cologuard because she does not have insurance.  She is going to try to get mammogram with mammogram scholarship. Denies f/c/s, n/v/d, hemoptysis, PND, leg swelling Denies chest pain or edema        Allergies  Allergen Reactions   Ibuprofen Other (See Comments)    Pt stated when she takes ibuprofen her skin has a burning sensation    Immunization History  Administered Date(s) Administered   Influenza,inj,Quad PF,6+ Mos 02/18/2017, 02/17/2018, 02/18/2022    Past Medical History:  Diagnosis Date   Hypertension     Tobacco History: Social History   Tobacco Use  Smoking Status Never  Smokeless Tobacco Not on file   Counseling given: Not Answered   Outpatient Encounter Medications as of 04/23/2022  Medication Sig   [DISCONTINUED] amLODipine (NORVASC) 10 MG tablet Take 1 tablet (10 mg total) by mouth daily.   acetaminophen (TYLENOL) 500 MG tablet Take 500 mg by mouth continuous as needed.   amLODipine (NORVASC) 10 MG tablet Take 1 tablet (10 mg total) by mouth daily.   cyclobenzaprine (FLEXERIL) 5 MG tablet Take 1 tablet (5 mg total) by mouth 3 (three) times daily as needed for muscle spasms. (Patient not taking: Reported on 09/04/2021)   ibuprofen (ADVIL,MOTRIN) 600 MG tablet Take 1 tablet (600 mg total) by mouth every 6 (six) hours as needed. (Patient not taking: Reported on  09/04/2021)   levofloxacin (LEVAQUIN) 750 MG tablet Take 1 tablet (750 mg total) by mouth daily. (Patient not taking: Reported on 09/04/2021)   ondansetron (ZOFRAN) 4 MG tablet Take 1 tablet (4 mg total) by mouth every 6 (six) hours. (Patient not taking: Reported on 09/04/2021)   traMADol (ULTRAM) 50 MG tablet Take 1 tablet (50 mg total) by mouth every 6 (six) hours as needed. (Patient not taking: Reported on 09/04/2021)   traMADol (ULTRAM) 50 MG tablet Take 1 tablet (50 mg total) by mouth every 6 (six) hours as needed. (Patient not taking: Reported on 09/04/2021)   No facility-administered encounter medications on file as of 04/23/2022.     Review of Systems  Review of Systems  Constitutional: Negative.   HENT: Negative.    Cardiovascular: Negative.   Gastrointestinal: Negative.   Allergic/Immunologic: Negative.   Neurological: Negative.   Psychiatric/Behavioral: Negative.         Physical Exam  BP (!) 156/87   Pulse 68   Temp 97.6 F (36.4 C)   Ht 4\' 10"  (1.473 m)   Wt 144 lb (65.3 kg)   SpO2 98%   BMI 30.10 kg/m   Wt Readings from Last 5 Encounters:  04/23/22 144 lb (65.3 kg)  02/25/22 150 lb (68 kg)  09/04/21 154 lb 2 oz (69.9 kg)  07/02/13 150 lb 4.8 oz (68.2 kg)     Physical Exam Vitals and nursing note  reviewed.  Constitutional:      General: She is not in acute distress.    Appearance: She is well-developed.  Cardiovascular:     Rate and Rhythm: Normal rate and regular rhythm.  Pulmonary:     Effort: Pulmonary effort is normal.     Breath sounds: Normal breath sounds.  Neurological:     Mental Status: She is alert and oriented to person, place, and time.      Lab Results:  CBC    Component Value Date/Time   WBC 4.2 09/04/2021 1501   WBC 9.6 07/02/2013 1544   RBC 4.78 09/04/2021 1501   RBC 4.32 07/02/2013 1544   HGB 12.8 09/04/2021 1501   HCT 38.8 09/04/2021 1501   PLT 214 09/04/2021 1501   MCV 81 09/04/2021 1501   MCH 26.8 09/04/2021 1501   MCH  26.9 07/02/2013 1544   MCHC 33.0 09/04/2021 1501   MCHC 33.8 07/02/2013 1544   RDW 14.6 09/04/2021 1501   LYMPHSABS 1.5 07/18/2008 0300   MONOABS 0.3 07/18/2008 0300   EOSABS 0.1 07/18/2008 0300   BASOSABS 0.0 07/18/2008 0300    BMET    Component Value Date/Time   NA 141 09/04/2021 1501   K 4.5 09/04/2021 1501   CL 103 09/04/2021 1501   CO2 26 09/04/2021 1501   GLUCOSE 98 09/04/2021 1501   GLUCOSE 142 (H) 07/02/2013 1544   BUN 11 09/04/2021 1501   CREATININE 0.67 09/04/2021 1501   CALCIUM 9.3 09/04/2021 1501   GFRNONAA >90 07/02/2013 1544   GFRAA >90 07/02/2013 1544      Assessment & Plan:   Essential hypertension, benign - amLODipine (NORVASC) 10 MG tablet; Take 1 tablet (10 mg total) by mouth daily.  Dispense: 90 tablet; Refill: 0 - CBC - Comprehensive metabolic panel  2. Encounter for screening mammogram for malignant neoplasm of breast  - MM Digital Screening; Future  Follow up:  Follow up in 3 months     Ivonne Andrew, NP 04/23/2022

## 2022-04-23 NOTE — Assessment & Plan Note (Signed)
-   amLODipine (NORVASC) 10 MG tablet; Take 1 tablet (10 mg total) by mouth daily.  Dispense: 90 tablet; Refill: 0 - CBC - Comprehensive metabolic panel  2. Encounter for screening mammogram for malignant neoplasm of breast  - MM Digital Screening; Future  Follow up:  Follow up in 3 months

## 2022-04-23 NOTE — Patient Instructions (Signed)
1. ESSENTIAL HYPERTENSION, BENIGN  - amLODipine (NORVASC) 10 MG tablet; Take 1 tablet (10 mg total) by mouth daily.  Dispense: 90 tablet; Refill: 0 - CBC - Comprehensive metabolic panel  2. Encounter for screening mammogram for malignant neoplasm of breast  - MM Digital Screening; Future  Follow up:  Follow up in 3 months

## 2022-04-24 LAB — COMPREHENSIVE METABOLIC PANEL
ALT: 33 IU/L — ABNORMAL HIGH (ref 0–32)
AST: 27 IU/L (ref 0–40)
Albumin/Globulin Ratio: 1.3 (ref 1.2–2.2)
Albumin: 4.1 g/dL (ref 3.8–4.9)
Alkaline Phosphatase: 82 IU/L (ref 44–121)
BUN/Creatinine Ratio: 14 (ref 12–28)
BUN: 8 mg/dL (ref 8–27)
Bilirubin Total: 0.3 mg/dL (ref 0.0–1.2)
CO2: 23 mmol/L (ref 20–29)
Calcium: 9.5 mg/dL (ref 8.7–10.3)
Chloride: 102 mmol/L (ref 96–106)
Creatinine, Ser: 0.58 mg/dL (ref 0.57–1.00)
Globulin, Total: 3.1 g/dL (ref 1.5–4.5)
Glucose: 93 mg/dL (ref 70–99)
Potassium: 3.3 mmol/L — ABNORMAL LOW (ref 3.5–5.2)
Sodium: 140 mmol/L (ref 134–144)
Total Protein: 7.2 g/dL (ref 6.0–8.5)
eGFR: 104 mL/min/{1.73_m2} (ref >59)

## 2022-04-24 LAB — CBC
Hematocrit: 38.8 % (ref 34.0–46.6)
Hemoglobin: 12.3 g/dL (ref 11.1–15.9)
MCH: 25.7 pg — ABNORMAL LOW (ref 26.6–33.0)
MCHC: 31.7 g/dL (ref 31.5–35.7)
MCV: 81 fL (ref 79–97)
Platelets: 243 10*3/uL (ref 150–450)
RBC: 4.78 x10E6/uL (ref 3.77–5.28)
RDW: 14.3 % (ref 11.7–15.4)
WBC: 4.1 10*3/uL (ref 3.4–10.8)

## 2022-07-23 ENCOUNTER — Ambulatory Visit (INDEPENDENT_AMBULATORY_CARE_PROVIDER_SITE_OTHER): Payer: No Typology Code available for payment source | Admitting: Nurse Practitioner

## 2022-07-23 ENCOUNTER — Encounter: Payer: Self-pay | Admitting: Nurse Practitioner

## 2022-07-23 VITALS — BP 134/84 | HR 57 | Temp 97.7°F | Wt 145.4 lb

## 2022-07-23 DIAGNOSIS — I10 Essential (primary) hypertension: Secondary | ICD-10-CM

## 2022-07-23 DIAGNOSIS — Z1231 Encounter for screening mammogram for malignant neoplasm of breast: Secondary | ICD-10-CM

## 2022-07-23 DIAGNOSIS — Z13220 Encounter for screening for lipoid disorders: Secondary | ICD-10-CM

## 2022-07-23 MED ORDER — AMLODIPINE BESYLATE 10 MG PO TABS: 10.0000 mg | ORAL_TABLET | Freq: Every day | ORAL | 1 refills | Status: DC

## 2022-07-23 NOTE — Assessment & Plan Note (Signed)
-   MM Digital Screening; Future  2. Essential hypertension, benign  - MM Digital Screening; Future - CBC - Comprehensive metabolic panel - Lipid Panel - amLODipine (NORVASC) 10 MG tablet; Take 1 tablet (10 mg total) by mouth daily.  Dispense: 90 tablet; Refill: 1  3. Lipid screening  - Lipid Panel  4. ESSENTIAL HYPERTENSION, BENIGN  - MM Digital Screening; Future - CBC - Comprehensive metabolic panel - Lipid Panel - amLODipine (NORVASC) 10 MG tablet; Take 1 tablet (10 mg total) by mouth daily.  Dispense: 90 tablet; Refill: 1   Follow up:  Follow up in 6 months

## 2022-07-23 NOTE — Progress Notes (Signed)
@Patient  ID: Becky Spears, female    DOB: Jun 21, 1960, 62 y.o.   MRN: [id number]  Chief Complaint  Patient presents with   Hypertension    Follow up    Referring provider: Fenton Foy, NP   HPI  62 year old female with history of hypertension   Presents today for follow-up on hypertension.  Overall she has been doing well. Currently on Norvasc. No new issues or concerns today. Denies f/c/s, n/v/d, hemoptysis, PND, leg swelling Denies chest pain or edema      Allergies  Allergen Reactions   Ibuprofen Other (See Comments)    Pt stated when she takes ibuprofen her skin has a burning sensation    Immunization History  Administered Date(s) Administered   Influenza,inj,Quad PF,6+ Mos 02/18/2017, 02/17/2018, 02/18/2022    Past Medical History:  Diagnosis Date   Hypertension     Tobacco History: Social History   Tobacco Use  Smoking Status Never  Smokeless Tobacco Not on file   Counseling given: Not Answered   Outpatient Encounter Medications as of 07/23/2022  Medication Sig   acetaminophen (TYLENOL) 500 MG tablet Take 500 mg by mouth continuous as needed.   [DISCONTINUED] amLODipine (NORVASC) 10 MG tablet Take 1 tablet (10 mg total) by mouth daily.   amLODipine (NORVASC) 10 MG tablet Take 1 tablet (10 mg total) by mouth daily.   cyclobenzaprine (FLEXERIL) 5 MG tablet Take 1 tablet (5 mg total) by mouth 3 (three) times daily as needed for muscle spasms. (Patient not taking: Reported on 09/04/2021)   ibuprofen (ADVIL,MOTRIN) 600 MG tablet Take 1 tablet (600 mg total) by mouth every 6 (six) hours as needed. (Patient not taking: Reported on 09/04/2021)   levofloxacin (LEVAQUIN) 750 MG tablet Take 1 tablet (750 mg total) by mouth daily. (Patient not taking: Reported on 09/04/2021)   ondansetron (ZOFRAN) 4 MG tablet Take 1 tablet (4 mg total) by mouth every 6 (six) hours. (Patient not taking: Reported on 09/04/2021)   traMADol (ULTRAM) 50 MG tablet Take 1 tablet (50 mg total) by  mouth every 6 (six) hours as needed. (Patient not taking: Reported on 09/04/2021)   traMADol (ULTRAM) 50 MG tablet Take 1 tablet (50 mg total) by mouth every 6 (six) hours as needed. (Patient not taking: Reported on 09/04/2021)   No facility-administered encounter medications on file as of 07/23/2022.     Review of Systems  Review of Systems  Constitutional: Negative.   HENT: Negative.    Cardiovascular: Negative.   Gastrointestinal: Negative.   Allergic/Immunologic: Negative.   Neurological: Negative.   Psychiatric/Behavioral: Negative.         Physical Exam  BP (!) 134/92   Pulse (!) 57   Temp 97.7 F (36.5 C)   Wt 145 lb 6.4 oz (66 kg)   SpO2 99%   BMI 30.39 kg/m   Wt Readings from Last 5 Encounters:  07/23/22 145 lb 6.4 oz (66 kg)  04/23/22 144 lb (65.3 kg)  02/25/22 150 lb (68 kg)  09/04/21 154 lb 2 oz (69.9 kg)  07/02/13 150 lb 4.8 oz (68.2 kg)     Physical Exam Vitals and nursing note reviewed.  Constitutional:      General: She is not in acute distress.    Appearance: She is well-developed.  Cardiovascular:     Rate and Rhythm: Normal rate and regular rhythm.  Pulmonary:     Effort: Pulmonary effort is normal.     Breath sounds: Normal breath sounds.  Neurological:  Mental Status: She is alert and oriented to person, place, and time.      Lab Results:  CBC    Component Value Date/Time   WBC 4.1 04/23/2022 0854   WBC 9.6 07/02/2013 1544   RBC 4.78 04/23/2022 0854   RBC 4.32 07/02/2013 1544   HGB 12.3 04/23/2022 0854   HCT 38.8 04/23/2022 0854   PLT 243 04/23/2022 0854   MCV 81 04/23/2022 0854   MCH 25.7 (L) 04/23/2022 0854   MCH 26.9 07/02/2013 1544   MCHC 31.7 04/23/2022 0854   MCHC 33.8 07/02/2013 1544   RDW 14.3 04/23/2022 0854   LYMPHSABS 1.5 07/18/2008 0300   MONOABS 0.3 07/18/2008 0300   EOSABS 0.1 07/18/2008 0300   BASOSABS 0.0 07/18/2008 0300    BMET    Component Value Date/Time   NA 140 04/23/2022 0854   K 3.3 (L)  04/23/2022 0854   CL 102 04/23/2022 0854   CO2 23 04/23/2022 0854   GLUCOSE 93 04/23/2022 0854   GLUCOSE 142 (H) 07/02/2013 1544   BUN 8 04/23/2022 0854   CREATININE 0.58 04/23/2022 0854   CALCIUM 9.5 04/23/2022 0854   GFRNONAA >90 07/02/2013 1544   GFRAA >90 07/02/2013 1544      Assessment & Plan:   Essential hypertension, benign - MM Digital Screening; Future  2. Essential hypertension, benign  - MM Digital Screening; Future - CBC - Comprehensive metabolic panel - Lipid Panel - amLODipine (NORVASC) 10 MG tablet; Take 1 tablet (10 mg total) by mouth daily.  Dispense: 90 tablet; Refill: 1  3. Lipid screening  - Lipid Panel  4. ESSENTIAL HYPERTENSION, BENIGN  - MM Digital Screening; Future - CBC - Comprehensive metabolic panel - Lipid Panel - amLODipine (NORVASC) 10 MG tablet; Take 1 tablet (10 mg total) by mouth daily.  Dispense: 90 tablet; Refill: 1   Follow up:  Follow up in 6 months     Fenton Foy, NP 07/23/2022

## 2022-07-23 NOTE — Patient Instructions (Signed)
1. Encounter for screening mammogram for malignant neoplasm of breast  - MM Digital Screening; Future  2. Essential hypertension, benign  - MM Digital Screening; Future - CBC - Comprehensive metabolic panel - Lipid Panel - amLODipine (NORVASC) 10 MG tablet; Take 1 tablet (10 mg total) by mouth daily.  Dispense: 90 tablet; Refill: 1  3. Lipid screening  - Lipid Panel  4. ESSENTIAL HYPERTENSION, BENIGN  - MM Digital Screening; Future - CBC - Comprehensive metabolic panel - Lipid Panel - amLODipine (NORVASC) 10 MG tablet; Take 1 tablet (10 mg total) by mouth daily.  Dispense: 90 tablet; Refill: 1   Follow up:  Follow up in 6 months

## 2022-07-24 ENCOUNTER — Other Ambulatory Visit: Payer: Self-pay | Admitting: Nurse Practitioner

## 2022-07-24 LAB — COMPREHENSIVE METABOLIC PANEL
ALT: 24 IU/L (ref 0–32)
AST: 26 IU/L (ref 0–40)
Albumin/Globulin Ratio: 1.4 (ref 1.2–2.2)
Albumin: 4.2 g/dL (ref 3.9–4.9)
Alkaline Phosphatase: 96 IU/L (ref 44–121)
BUN/Creatinine Ratio: 19 (ref 12–28)
BUN: 11 mg/dL (ref 8–27)
Bilirubin Total: 0.4 mg/dL (ref 0.0–1.2)
CO2: 21 mmol/L (ref 20–29)
Calcium: 9.1 mg/dL (ref 8.7–10.3)
Chloride: 103 mmol/L (ref 96–106)
Creatinine, Ser: 0.59 mg/dL (ref 0.57–1.00)
Globulin, Total: 3 g/dL (ref 1.5–4.5)
Glucose: 91 mg/dL (ref 70–99)
Potassium: 3.7 mmol/L (ref 3.5–5.2)
Sodium: 139 mmol/L (ref 134–144)
Total Protein: 7.2 g/dL (ref 6.0–8.5)
eGFR: 102 mL/min/{1.73_m2} (ref >59)

## 2022-07-24 LAB — LIPID PANEL
Chol/HDL Ratio: 5.2 ratio — ABNORMAL HIGH (ref 0.0–4.4)
Cholesterol, Total: 312 mg/dL — ABNORMAL HIGH (ref 100–199)
HDL: 60 mg/dL (ref >39)
LDL Chol Calc (NIH): 221 mg/dL — ABNORMAL HIGH (ref 0–99)
Triglycerides: 165 mg/dL — ABNORMAL HIGH (ref 0–149)
VLDL Cholesterol Cal: 31 mg/dL (ref 5–40)

## 2022-07-24 LAB — CBC
Hematocrit: 39.1 % (ref 34.0–46.6)
Hemoglobin: 12.5 g/dL (ref 11.1–15.9)
MCH: 26.2 pg — ABNORMAL LOW (ref 26.6–33.0)
MCHC: 32 g/dL (ref 31.5–35.7)
MCV: 82 fL (ref 79–97)
Platelets: 227 10*3/uL (ref 150–450)
RBC: 4.77 x10E6/uL (ref 3.77–5.28)
RDW: 14.8 % (ref 11.7–15.4)
WBC: 3.5 10*3/uL (ref 3.4–10.8)

## 2022-07-24 MED ORDER — ROSUVASTATIN CALCIUM 10 MG PO TABS: 10.0000 mg | ORAL_TABLET | Freq: Every day | ORAL | 11 refills | Status: DC

## 2022-08-26 ENCOUNTER — Ambulatory Visit: Payer: Self-pay | Admitting: Nurse Practitioner

## 2023-01-25 ENCOUNTER — Ambulatory Visit (INDEPENDENT_AMBULATORY_CARE_PROVIDER_SITE_OTHER): Payer: No Typology Code available for payment source | Admitting: Nurse Practitioner

## 2023-01-25 ENCOUNTER — Encounter: Payer: Self-pay | Admitting: Nurse Practitioner

## 2023-01-25 ENCOUNTER — Other Ambulatory Visit: Payer: Self-pay

## 2023-01-25 VITALS — BP 133/76 | HR 61 | Wt 146.4 lb

## 2023-01-25 DIAGNOSIS — I10 Essential (primary) hypertension: Secondary | ICD-10-CM

## 2023-01-25 DIAGNOSIS — Z13220 Encounter for screening for lipoid disorders: Secondary | ICD-10-CM

## 2023-01-25 DIAGNOSIS — E782 Mixed hyperlipidemia: Secondary | ICD-10-CM

## 2023-01-25 MED ORDER — ROSUVASTATIN CALCIUM 10 MG PO TABS
10.0000 mg | ORAL_TABLET | Freq: Every day | ORAL | 1 refills | Status: DC
  Filled 2023-01-25: qty 90, 90d supply, fill #0

## 2023-01-25 MED ORDER — AMLODIPINE BESYLATE 10 MG PO TABS
10.0000 mg | ORAL_TABLET | Freq: Every day | ORAL | 1 refills | Status: DC
  Filled 2023-01-25: qty 90, 90d supply, fill #0

## 2023-01-25 NOTE — Progress Notes (Signed)
Subjective   Patient ID: Becky Spears, female    DOB: [birth year], 62 y.o.   MRN: [id number]  Chief Complaint  Patient presents with   Medical Management of Chronic Issues   Medication Refill    Referring provider: Ivonne Andrew, NP  Becky Spears is a 62 y.o. female with Past Medical History: No date: Hypertension  HPI:   Presents today for follow-up on hypertension.  Overall she has been doing well. Currently on Norvasc.  Patient was also started on Crestor at her last appointment due to elevated cholesterol levels.  We will recheck lipid panel today.  Patient does need refills on Norvasc and Crestor today.  No new issues or concerns today. Denies f/c/s, n/v/d, hemoptysis, PND, leg swelling. Denies chest pain or edema.     Allergies  Allergen Reactions   Ibuprofen Other (See Comments)    Pt stated when she takes ibuprofen her skin has a burning sensation    Immunization History  Administered Date(s) Administered   Influenza,inj,Quad PF,6+ Mos 02/18/2017, 02/17/2018, 02/18/2022    Tobacco History: Social History   Tobacco Use  Smoking Status Never  Smokeless Tobacco Not on file   Counseling given: Not Answered   Outpatient Encounter Medications as of 01/25/2023  Medication Sig   acetaminophen (TYLENOL) 500 MG tablet Take 500 mg by mouth continuous as needed.   cyclobenzaprine (FLEXERIL) 5 MG tablet Take 1 tablet (5 mg total) by mouth 3 (three) times daily as needed for muscle spasms.   ibuprofen (ADVIL,MOTRIN) 600 MG tablet Take 1 tablet (600 mg total) by mouth every 6 (six) hours as needed.   levofloxacin (LEVAQUIN) 750 MG tablet Take 1 tablet (750 mg total) by mouth daily.   ondansetron (ZOFRAN) 4 MG tablet Take 1 tablet (4 mg total) by mouth every 6 (six) hours.   traMADol (ULTRAM) 50 MG tablet Take 1 tablet (50 mg total) by mouth every 6 (six) hours as needed.   traMADol (ULTRAM) 50 MG tablet Take 1 tablet (50 mg total) by mouth every 6 (six) hours as needed.    [DISCONTINUED] rosuvastatin (CRESTOR) 10 MG tablet Take 1 tablet (10 mg total) by mouth daily.   amLODipine (NORVASC) 10 MG tablet Take 1 tablet (10 mg total) by mouth daily.   rosuvastatin (CRESTOR) 10 MG tablet Take 1 tablet (10 mg total) by mouth daily.   [DISCONTINUED] amLODipine (NORVASC) 10 MG tablet Take 1 tablet (10 mg total) by mouth daily.   No facility-administered encounter medications on file as of 01/25/2023.    Review of Systems  Review of Systems  Constitutional: Negative.   HENT: Negative.    Cardiovascular: Negative.   Gastrointestinal: Negative.   Allergic/Immunologic: Negative.   Neurological: Negative.   Psychiatric/Behavioral: Negative.       Objective:   BP 133/76 (BP Location: Left Arm, Patient Position: Sitting, Cuff Size: Normal)   Pulse 61   Wt 146 lb 6.4 oz (66.4 kg)   SpO2 99%   BMI 30.60 kg/m   Wt Readings from Last 5 Encounters:  01/25/23 146 lb 6.4 oz (66.4 kg)  07/23/22 145 lb 6.4 oz (66 kg)  04/23/22 144 lb (65.3 kg)  02/25/22 150 lb (68 kg)  09/04/21 154 lb 2 oz (69.9 kg)     Physical Exam Vitals and nursing note reviewed.  Constitutional:      General: She is not in acute distress.    Appearance: She is well-developed.  Cardiovascular:     Rate and Rhythm: Normal  rate and regular rhythm.  Pulmonary:     Effort: Pulmonary effort is normal.     Breath sounds: Normal breath sounds.  Neurological:     Mental Status: She is alert and oriented to person, place, and time.       Assessment & Plan:   Mixed hyperlipidemia -     Rosuvastatin Calcium; Take 1 tablet (10 mg total) by mouth daily.  Dispense: 90 tablet; Refill: 1 -     Lipid panel  ESSENTIAL HYPERTENSION, BENIGN -     amLODIPine Besylate; Take 1 tablet (10 mg total) by mouth daily.  Dispense: 90 tablet; Refill: 1 -     CBC -     Basic metabolic panel  Lipid screening -     Lipid panel     Return in about 6 months (around 07/25/2023).   Ivonne Andrew,  NP 01/25/2023

## 2023-01-25 NOTE — Progress Notes (Signed)
   Becky Spears [phone]

## 2023-01-25 NOTE — Patient Instructions (Signed)
1. ESSENTIAL HYPERTENSION, BENIGN  - amLODipine (NORVASC) 10 MG tablet; Take 1 tablet (10 mg total) by mouth daily.  Dispense: 90 tablet; Refill: 1 - CBC - Basic Metabolic Panel  2. Mixed hyperlipidemia  - rosuvastatin (CRESTOR) 10 MG tablet; Take 1 tablet (10 mg total) by mouth daily.  Dispense: 90 tablet; Refill: 1 - Lipid Panel  3. Lipid screening  - Lipid Panel  Follow up:  Follow up in 6 months

## 2023-01-26 LAB — BASIC METABOLIC PANEL
BUN/Creatinine Ratio: 15 (ref 12–28)
BUN: 10 mg/dL (ref 8–27)
CO2: 26 mmol/L (ref 20–29)
Calcium: 9.3 mg/dL (ref 8.7–10.3)
Chloride: 101 mmol/L (ref 96–106)
Creatinine, Ser: 0.68 mg/dL (ref 0.57–1.00)
Glucose: 94 mg/dL (ref 70–99)
Potassium: 4 mmol/L (ref 3.5–5.2)
Sodium: 139 mmol/L (ref 134–144)
eGFR: 99 mL/min/{1.73_m2} (ref >59)

## 2023-01-26 LAB — CBC
Hematocrit: 41.7 % (ref 34.0–46.6)
Hemoglobin: 12.9 g/dL (ref 11.1–15.9)
MCH: 26.2 pg — ABNORMAL LOW (ref 26.6–33.0)
MCHC: 30.9 g/dL — ABNORMAL LOW (ref 31.5–35.7)
MCV: 85 fL (ref 79–97)
Platelets: 209 10*3/uL (ref 150–450)
RBC: 4.92 x10E6/uL (ref 3.77–5.28)
RDW: 14.9 % (ref 11.7–15.4)
WBC: 4.4 10*3/uL (ref 3.4–10.8)

## 2023-01-26 LAB — LIPID PANEL
Chol/HDL Ratio: 5 ratio — ABNORMAL HIGH (ref 0.0–4.4)
Cholesterol, Total: 265 mg/dL — ABNORMAL HIGH (ref 100–199)
HDL: 53 mg/dL (ref >39)
LDL Chol Calc (NIH): 167 mg/dL — ABNORMAL HIGH (ref 0–99)
Triglycerides: 243 mg/dL — ABNORMAL HIGH (ref 0–149)
VLDL Cholesterol Cal: 45 mg/dL — ABNORMAL HIGH (ref 5–40)

## 2023-01-29 ENCOUNTER — Other Ambulatory Visit: Payer: Self-pay

## 2023-07-26 ENCOUNTER — Ambulatory Visit: Payer: Self-pay | Admitting: Nurse Practitioner

## 2023-08-02 ENCOUNTER — Other Ambulatory Visit: Payer: Self-pay

## 2023-08-02 ENCOUNTER — Encounter: Payer: Self-pay | Admitting: Nurse Practitioner

## 2023-08-02 ENCOUNTER — Ambulatory Visit (INDEPENDENT_AMBULATORY_CARE_PROVIDER_SITE_OTHER): Payer: Self-pay | Admitting: Nurse Practitioner

## 2023-08-02 DIAGNOSIS — I10 Essential (primary) hypertension: Secondary | ICD-10-CM

## 2023-08-02 DIAGNOSIS — E782 Mixed hyperlipidemia: Secondary | ICD-10-CM

## 2023-08-02 MED ORDER — AMLODIPINE BESYLATE 10 MG PO TABS
10.0000 mg | ORAL_TABLET | Freq: Every day | ORAL | 1 refills | Status: DC
  Filled 2023-08-02: qty 90, 90d supply, fill #0

## 2023-08-02 MED ORDER — ROSUVASTATIN CALCIUM 10 MG PO TABS
10.0000 mg | ORAL_TABLET | Freq: Every day | ORAL | 1 refills | Status: DC
  Filled 2023-08-02: qty 90, 90d supply, fill #0

## 2023-08-02 NOTE — Progress Notes (Signed)
 Subjective   Patient ID: Becky Spears, female    DOB: [birth year], 63 y.o.   MRN: [id number]  Chief Complaint  Patient presents with   Medical Management of Chronic Issues   Hypertension    Patient stated that she do not have a blood pressure cuff at home to check her blood pressures.    Referring provider: Ivonne Andrew, NP  Becky Spears is a 63 y.o. female with Past Medical History: No date: Hypertension   HPI  Presents today for follow-up on hypertension.  Overall she has been doing well. Currently on Norvasc.  Patient was also started on Crestor at her last appointment due to elevated cholesterol levels.  We will recheck lipid panel today.  Patient does need refills on Norvasc and Crestor today.  No new issues or concerns today. Denies f/c/s, n/v/d, hemoptysis, PND, leg swelling. Denies chest pain or edema.    Allergies  Allergen Reactions   Ibuprofen Other (See Comments)    Pt stated when she takes ibuprofen her skin has a burning sensation    Immunization History  Administered Date(s) Administered   Influenza,inj,Quad PF,6+ Mos 02/18/2017, 02/17/2018, 02/18/2022    Tobacco History: Social History   Tobacco Use  Smoking Status Never  Smokeless Tobacco Not on file   Counseling given: Not Answered   Outpatient Encounter Medications as of 08/02/2023  Medication Sig   acetaminophen (TYLENOL) 500 MG tablet Take 500 mg by mouth continuous as needed.   cyclobenzaprine (FLEXERIL) 5 MG tablet Take 1 tablet (5 mg total) by mouth 3 (three) times daily as needed for muscle spasms.   ibuprofen (ADVIL,MOTRIN) 600 MG tablet Take 1 tablet (600 mg total) by mouth every 6 (six) hours as needed.   levofloxacin (LEVAQUIN) 750 MG tablet Take 1 tablet (750 mg total) by mouth daily.   ondansetron (ZOFRAN) 4 MG tablet Take 1 tablet (4 mg total) by mouth every 6 (six) hours.   traMADol (ULTRAM) 50 MG tablet Take 1 tablet (50 mg total) by mouth every 6 (six) hours as needed.   [DISCONTINUED]  amLODipine (NORVASC) 10 MG tablet Take 1 tablet (10 mg total) by mouth daily.   [DISCONTINUED] rosuvastatin (CRESTOR) 10 MG tablet Take 1 tablet (10 mg total) by mouth daily.   amLODipine (NORVASC) 10 MG tablet Take 1 tablet (10 mg total) by mouth daily.   rosuvastatin (CRESTOR) 10 MG tablet Take 1 tablet (10 mg total) by mouth daily.   [DISCONTINUED] traMADol (ULTRAM) 50 MG tablet Take 1 tablet (50 mg total) by mouth every 6 (six) hours as needed.   No facility-administered encounter medications on file as of 08/02/2023.    Review of Systems  Review of Systems  Constitutional: Negative.   HENT: Negative.    Cardiovascular: Negative.   Gastrointestinal: Negative.   Allergic/Immunologic: Negative.   Neurological: Negative.   Psychiatric/Behavioral: Negative.       Objective:   BP 132/76   Pulse 66   Temp 98.8 F (37.1 C) (Oral)   Wt 139 lb 12.8 oz (63.4 kg)   SpO2 100%   BMI 29.22 kg/m   Wt Readings from Last 5 Encounters:  08/02/23 139 lb 12.8 oz (63.4 kg)  01/25/23 146 lb 6.4 oz (66.4 kg)  07/23/22 145 lb 6.4 oz (66 kg)  04/23/22 144 lb (65.3 kg)  02/25/22 150 lb (68 kg)     Physical Exam Vitals and nursing note reviewed.  Constitutional:      General: She is not in acute  distress.    Appearance: She is well-developed.  Cardiovascular:     Rate and Rhythm: Normal rate and regular rhythm.  Pulmonary:     Effort: Pulmonary effort is normal.     Breath sounds: Normal breath sounds.  Neurological:     Mental Status: She is alert and oriented to person, place, and time.       Assessment & Plan:   ESSENTIAL HYPERTENSION, BENIGN -     amLODIPine Besylate; Take 1 tablet (10 mg total) by mouth daily.  Dispense: 90 tablet; Refill: 1 -     CBC -     Comprehensive metabolic panel with GFR  Mixed hyperlipidemia -     Rosuvastatin Calcium; Take 1 tablet (10 mg total) by mouth daily.  Dispense: 90 tablet; Refill: 1 -     Lipid panel     Return in about 6 months  (around 02/01/2024).   Ivonne Andrew, NP 08/02/2023

## 2023-08-02 NOTE — Patient Instructions (Signed)
 1. ESSENTIAL HYPERTENSION, BENIGN  - amLODipine (NORVASC) 10 MG tablet; Take 1 tablet (10 mg total) by mouth daily.  Dispense: 90 tablet; Refill: 1 - CBC - Comprehensive metabolic panel with GFR  2. Mixed hyperlipidemia  - rosuvastatin (CRESTOR) 10 MG tablet; Take 1 tablet (10 mg total) by mouth daily.  Dispense: 90 tablet; Refill: 1 - Lipid Panel  Follow up:  Follow up in 6 months

## 2023-08-03 LAB — COMPREHENSIVE METABOLIC PANEL WITH GFR
ALT: 27 IU/L (ref 0–32)
AST: 25 IU/L (ref 0–40)
Albumin: 4.4 g/dL (ref 3.9–4.9)
Alkaline Phosphatase: 72 IU/L (ref 44–121)
BUN/Creatinine Ratio: 27 (ref 12–28)
BUN: 19 mg/dL (ref 8–27)
Bilirubin Total: 0.2 mg/dL (ref 0.0–1.2)
CO2: 24 mmol/L (ref 20–29)
Calcium: 9.5 mg/dL (ref 8.7–10.3)
Chloride: 101 mmol/L (ref 96–106)
Creatinine, Ser: 0.7 mg/dL (ref 0.57–1.00)
Globulin, Total: 2.9 g/dL (ref 1.5–4.5)
Glucose: 85 mg/dL (ref 70–99)
Potassium: 5.1 mmol/L (ref 3.5–5.2)
Sodium: 139 mmol/L (ref 134–144)
Total Protein: 7.3 g/dL (ref 6.0–8.5)
eGFR: 98 mL/min/{1.73_m2} (ref >59)

## 2023-08-03 LAB — LIPID PANEL
Chol/HDL Ratio: 3 ratio (ref 0.0–4.4)
Cholesterol, Total: 206 mg/dL — ABNORMAL HIGH (ref 100–199)
HDL: 68 mg/dL (ref >39)
LDL Chol Calc (NIH): 119 mg/dL — ABNORMAL HIGH (ref 0–99)
Triglycerides: 110 mg/dL (ref 0–149)
VLDL Cholesterol Cal: 19 mg/dL (ref 5–40)

## 2023-08-03 LAB — CBC
Hematocrit: 42 % (ref 34.0–46.6)
Hemoglobin: 12.8 g/dL (ref 11.1–15.9)
MCH: 26.2 pg — ABNORMAL LOW (ref 26.6–33.0)
MCHC: 30.5 g/dL — ABNORMAL LOW (ref 31.5–35.7)
MCV: 86 fL (ref 79–97)
Platelets: 180 10*3/uL (ref 150–450)
RBC: 4.88 x10E6/uL (ref 3.77–5.28)
RDW: 14.5 % (ref 11.7–15.4)
WBC: 3.7 10*3/uL (ref 3.4–10.8)

## 2023-08-05 ENCOUNTER — Other Ambulatory Visit: Payer: Self-pay

## 2024-02-04 ENCOUNTER — Other Ambulatory Visit (HOSPITAL_COMMUNITY): Payer: Self-pay

## 2024-02-04 ENCOUNTER — Ambulatory Visit (INDEPENDENT_AMBULATORY_CARE_PROVIDER_SITE_OTHER): Payer: Self-pay | Admitting: Nurse Practitioner

## 2024-02-04 ENCOUNTER — Other Ambulatory Visit: Payer: Self-pay

## 2024-02-04 ENCOUNTER — Encounter: Payer: Self-pay | Admitting: Nurse Practitioner

## 2024-02-04 DIAGNOSIS — I10 Essential (primary) hypertension: Secondary | ICD-10-CM

## 2024-02-04 DIAGNOSIS — E782 Mixed hyperlipidemia: Secondary | ICD-10-CM

## 2024-02-04 MED ORDER — AMLODIPINE BESYLATE 10 MG PO TABS
10.0000 mg | ORAL_TABLET | Freq: Every day | ORAL | 1 refills | Status: AC
  Filled 2024-02-04: qty 90, 90d supply, fill #0

## 2024-02-04 MED ORDER — ONDANSETRON HCL 4 MG PO TABS: 4.0000 mg | ORAL_TABLET | Freq: Four times a day (QID) | ORAL | 0 refills | Status: AC

## 2024-02-04 MED ORDER — ROSUVASTATIN CALCIUM 10 MG PO TABS
10.0000 mg | ORAL_TABLET | Freq: Every day | ORAL | 1 refills | Status: AC
  Filled 2024-02-04: qty 90, 90d supply, fill #0

## 2024-02-04 NOTE — Progress Notes (Signed)
 Subjective   Patient ID: Becky Spears, female    DOB: 1961-03-06, 63 y.o.   MRN: [id number]  Chief Complaint  Patient presents with   Medical Management of Chronic Issues   Medication Refill    Referring provider: Oley Bascom RAMAN, NP  Becky Spears is a 63 y.o. female with Past Medical History: No date: Hypertension   HPI  Presents today for follow-up on hypertension. Overall she has been doing well. Currently on Norvasc . Patient was also started on Crestor  at her last appointment due to elevated cholesterol levels. We will recheck lipid panel today. Patient does need refills on Norvasc  and Crestor  today. No new issues or concerns today. Denies f/c/s, n/v/d, hemoptysis, PND, leg swelling. Denies chest pain or edema.     Allergies  Allergen Reactions   Ibuprofen  Other (See Comments)    Pt stated when she takes ibuprofen  her skin has a burning sensation    Immunization History  Administered Date(s) Administered   Influenza,inj,Quad PF,6+ Mos 02/18/2017, 02/17/2018, 02/18/2022    Tobacco History: Social History   Tobacco Use  Smoking Status Never  Smokeless Tobacco Not on file   Counseling given: Not Answered   Outpatient Encounter Medications as of 02/04/2024  Medication Sig   acetaminophen  (TYLENOL ) 500 MG tablet Take 500 mg by mouth continuous as needed.   cyclobenzaprine  (FLEXERIL ) 5 MG tablet Take 1 tablet (5 mg total) by mouth 3 (three) times daily as needed for muscle spasms.   ibuprofen  (ADVIL ,MOTRIN ) 600 MG tablet Take 1 tablet (600 mg total) by mouth every 6 (six) hours as needed.   levofloxacin  (LEVAQUIN ) 750 MG tablet Take 1 tablet (750 mg total) by mouth daily.   traMADol  (ULTRAM ) 50 MG tablet Take 1 tablet (50 mg total) by mouth every 6 (six) hours as needed.   [DISCONTINUED] amLODipine  (NORVASC ) 10 MG tablet Take 1 tablet (10 mg total) by mouth daily.   [DISCONTINUED] ondansetron  (ZOFRAN ) 4 MG tablet Take 1 tablet (4 mg total) by mouth every 6 (six) hours.    [DISCONTINUED] rosuvastatin  (CRESTOR ) 10 MG tablet Take 1 tablet (10 mg total) by mouth daily.   amLODipine  (NORVASC ) 10 MG tablet Take 1 tablet (10 mg total) by mouth daily.   ondansetron  (ZOFRAN ) 4 MG tablet Take 1 tablet (4 mg total) by mouth every 6 (six) hours.   rosuvastatin  (CRESTOR ) 10 MG tablet Take 1 tablet (10 mg total) by mouth daily.   No facility-administered encounter medications on file as of 02/04/2024.    Review of Systems  Review of Systems  Constitutional: Negative.   HENT: Negative.    Cardiovascular: Negative.   Gastrointestinal: Negative.   Allergic/Immunologic: Negative.   Neurological: Negative.   Psychiatric/Behavioral: Negative.       Objective:   BP 129/76 (BP Location: Left Arm, Patient Position: Sitting, Cuff Size: Normal)   Pulse 64   Wt 142 lb (64.4 kg)   SpO2 98%   BMI 29.68 kg/m   Wt Readings from Last 5 Encounters:  02/04/24 142 lb (64.4 kg)  08/02/23 139 lb 12.8 oz (63.4 kg)  01/25/23 146 lb 6.4 oz (66.4 kg)  07/23/22 145 lb 6.4 oz (66 kg)  04/23/22 144 lb (65.3 kg)     Physical Exam Vitals and nursing note reviewed.  Constitutional:      General: She is not in acute distress.    Appearance: She is well-developed.  Cardiovascular:     Rate and Rhythm: Normal rate and regular rhythm.  Pulmonary:  Effort: Pulmonary effort is normal.     Breath sounds: Normal breath sounds.  Neurological:     Mental Status: She is alert and oriented to person, place, and time.       Assessment & Plan:   ESSENTIAL HYPERTENSION, BENIGN -     amLODIPine  Besylate; Take 1 tablet (10 mg total) by mouth daily.  Dispense: 90 tablet; Refill: 1 -     Lipid panel -     CBC -     Comprehensive metabolic panel with GFR  Mixed hyperlipidemia -     Rosuvastatin  Calcium ; Take 1 tablet (10 mg total) by mouth daily.  Dispense: 90 tablet; Refill: 1 -     Lipid panel  Other orders -     Ondansetron  HCl; Take 1 tablet (4 mg total) by mouth every 6  (six) hours.  Dispense: 12 tablet; Refill: 0     Return in about 1 year (around 02/03/2025).   Bascom GORMAN Borer, NP 02/04/2024

## 2024-02-05 LAB — CBC
Hematocrit: 42.8 % (ref 34.0–46.6)
Hemoglobin: 12.8 g/dL (ref 11.1–15.9)
MCH: 26 pg — ABNORMAL LOW (ref 26.6–33.0)
MCHC: 29.9 g/dL — ABNORMAL LOW (ref 31.5–35.7)
MCV: 87 fL (ref 79–97)
Platelets: 206 x10E3/uL (ref 150–450)
RBC: 4.92 x10E6/uL (ref 3.77–5.28)
RDW: 14.3 % (ref 11.7–15.4)
WBC: 3.4 x10E3/uL (ref 3.4–10.8)

## 2024-02-05 LAB — COMPREHENSIVE METABOLIC PANEL WITH GFR
ALT: 24 IU/L (ref 0–32)
AST: 23 IU/L (ref 0–40)
Albumin: 4.3 g/dL (ref 3.9–4.9)
Alkaline Phosphatase: 69 IU/L (ref 49–135)
BUN/Creatinine Ratio: 19 (ref 12–28)
BUN: 14 mg/dL (ref 8–27)
Bilirubin Total: 0.3 mg/dL (ref 0.0–1.2)
CO2: 25 mmol/L (ref 20–29)
Calcium: 9.4 mg/dL (ref 8.7–10.3)
Chloride: 101 mmol/L (ref 96–106)
Creatinine, Ser: 0.73 mg/dL (ref 0.57–1.00)
Globulin, Total: 3 g/dL (ref 1.5–4.5)
Glucose: 70 mg/dL (ref 70–99)
Potassium: 4.7 mmol/L (ref 3.5–5.2)
Sodium: 141 mmol/L (ref 134–144)
Total Protein: 7.3 g/dL (ref 6.0–8.5)
eGFR: 93 mL/min/1.73 (ref >59)

## 2024-02-05 LAB — LIPID PANEL
Chol/HDL Ratio: 3.2 ratio (ref 0.0–4.4)
Cholesterol, Total: 216 mg/dL — ABNORMAL HIGH (ref 100–199)
HDL: 68 mg/dL (ref >39)
LDL Chol Calc (NIH): 127 mg/dL — ABNORMAL HIGH (ref 0–99)
Triglycerides: 121 mg/dL (ref 0–149)
VLDL Cholesterol Cal: 21 mg/dL (ref 5–40)

## 2024-02-07 ENCOUNTER — Ambulatory Visit: Payer: Self-pay | Admitting: Nurse Practitioner

## 2024-02-16 NOTE — Telephone Encounter (Signed)
 Left message using interpreter to have patient review mychart and/or call for results.  Ok for E2C2 to give information.

## 2024-02-16 NOTE — Telephone Encounter (Signed)
-----   Message from Bascom GORMAN Borer sent at 02/07/2024 11:52 AM EDT ----- Cholesterol is elevated.  Please make sure to take Crestor  daily as directed. ----- Message ----- From: Interface, [facility] Lab Results In Sent: 02/05/2024   5:39 AM EDT To: Bascom GORMAN Borer, NP

## 2025-02-05 ENCOUNTER — Ambulatory Visit: Payer: Self-pay | Admitting: Nurse Practitioner
